# Patient Record
Sex: Female | Born: 1950
Health system: Southern US, Community
[De-identification: ages and names within clinical notes are randomized; demographics above are authoritative.]

## PROBLEM LIST (undated history)

## (undated) DIAGNOSIS — I10 Essential (primary) hypertension: Secondary | ICD-10-CM

## (undated) DIAGNOSIS — I2510 Atherosclerotic heart disease of native coronary artery without angina pectoris: Principal | ICD-10-CM

## (undated) DIAGNOSIS — N3010 Interstitial cystitis (chronic) without hematuria: Secondary | ICD-10-CM

## (undated) DIAGNOSIS — M8580 Other specified disorders of bone density and structure, unspecified site: Secondary | ICD-10-CM

## (undated) DIAGNOSIS — E119 Type 2 diabetes mellitus without complications: Secondary | ICD-10-CM

## (undated) DIAGNOSIS — E785 Hyperlipidemia, unspecified: Secondary | ICD-10-CM

## (undated) DIAGNOSIS — N200 Calculus of kidney: Secondary | ICD-10-CM

## (undated) HISTORY — DX: Atherosclerotic heart disease of native coronary artery without angina pectoris: I25.10

## (undated) HISTORY — DX: Type 2 diabetes mellitus without complications: E11.9

## (undated) HISTORY — DX: Calculus of kidney: N20.0

## (undated) HISTORY — DX: Interstitial cystitis (chronic) without hematuria: N30.10

## (undated) HISTORY — DX: Hyperlipidemia, unspecified: E78.5

## (undated) HISTORY — DX: Other specified disorders of bone density and structure, unspecified site: M85.80

## (undated) HISTORY — DX: Essential (primary) hypertension: I10

---

## 1998-10-11 ENCOUNTER — Other Ambulatory Visit: Admission: RE | Admit: 1998-10-11 | Discharge: 1998-10-11 | Payer: Self-pay | Admitting: Obstetrics and Gynecology

## 1998-11-09 ENCOUNTER — Other Ambulatory Visit: Admission: RE | Admit: 1998-11-09 | Discharge: 1998-11-09 | Payer: Self-pay | Admitting: Obstetrics and Gynecology

## 2000-01-30 ENCOUNTER — Other Ambulatory Visit: Admission: RE | Admit: 2000-01-30 | Discharge: 2000-01-30 | Payer: Self-pay | Admitting: Family Medicine

## 2000-02-12 ENCOUNTER — Other Ambulatory Visit: Admission: RE | Admit: 2000-02-12 | Discharge: 2000-02-12 | Payer: Self-pay

## 2000-04-14 ENCOUNTER — Ambulatory Visit (HOSPITAL_COMMUNITY): Admission: RE | Admit: 2000-04-14 | Discharge: 2000-04-14 | Payer: Self-pay | Admitting: Gastroenterology

## 2002-06-09 ENCOUNTER — Other Ambulatory Visit: Admission: RE | Admit: 2002-06-09 | Discharge: 2002-06-09 | Payer: Self-pay | Admitting: Obstetrics and Gynecology

## 2003-07-27 ENCOUNTER — Other Ambulatory Visit: Admission: RE | Admit: 2003-07-27 | Discharge: 2003-07-27 | Payer: Self-pay | Admitting: Obstetrics and Gynecology

## 2005-03-28 ENCOUNTER — Other Ambulatory Visit: Admission: RE | Admit: 2005-03-28 | Discharge: 2005-03-28 | Payer: Self-pay | Admitting: Obstetrics and Gynecology

## 2006-01-17 ENCOUNTER — Encounter: Admission: RE | Admit: 2006-01-17 | Discharge: 2006-04-17 | Payer: Self-pay | Admitting: Family Medicine

## 2012-08-20 ENCOUNTER — Other Ambulatory Visit: Payer: Self-pay | Admitting: Obstetrics and Gynecology

## 2013-10-11 DIAGNOSIS — R319 Hematuria, unspecified: Secondary | ICD-10-CM | POA: Insufficient documentation

## 2013-10-11 DIAGNOSIS — R109 Unspecified abdominal pain: Secondary | ICD-10-CM | POA: Insufficient documentation

## 2013-10-14 DIAGNOSIS — N201 Calculus of ureter: Secondary | ICD-10-CM | POA: Insufficient documentation

## 2013-11-08 DIAGNOSIS — Z720 Tobacco use: Secondary | ICD-10-CM | POA: Insufficient documentation

## 2013-11-08 DIAGNOSIS — N3010 Interstitial cystitis (chronic) without hematuria: Secondary | ICD-10-CM | POA: Insufficient documentation

## 2013-11-08 DIAGNOSIS — E119 Type 2 diabetes mellitus without complications: Secondary | ICD-10-CM | POA: Insufficient documentation

## 2013-11-26 DIAGNOSIS — N200 Calculus of kidney: Secondary | ICD-10-CM | POA: Insufficient documentation

## 2014-03-21 DIAGNOSIS — N3020 Other chronic cystitis without hematuria: Secondary | ICD-10-CM | POA: Insufficient documentation

## 2014-09-13 ENCOUNTER — Other Ambulatory Visit: Payer: Self-pay | Admitting: Gastroenterology

## 2014-09-15 ENCOUNTER — Other Ambulatory Visit: Payer: Self-pay | Admitting: Obstetrics and Gynecology

## 2014-09-16 LAB — CYTOLOGY - PAP

## 2014-09-26 ENCOUNTER — Other Ambulatory Visit: Payer: Self-pay | Admitting: *Deleted

## 2014-09-26 DIAGNOSIS — N200 Calculus of kidney: Secondary | ICD-10-CM

## 2014-10-10 ENCOUNTER — Ambulatory Visit
Admission: RE | Admit: 2014-10-10 | Discharge: 2014-10-10 | Disposition: A | Discharge status: Home / Self Care | Payer: Managed Care, Other (non HMO) | Source: Ambulatory Visit | Attending: Urology | Admitting: Urology

## 2014-10-10 DIAGNOSIS — N200 Calculus of kidney: Secondary | ICD-10-CM

## 2014-10-13 ENCOUNTER — Other Ambulatory Visit: Payer: Self-pay | Admitting: Family Medicine

## 2014-10-13 DIAGNOSIS — F17200 Nicotine dependence, unspecified, uncomplicated: Secondary | ICD-10-CM

## 2015-10-20 DIAGNOSIS — Z0000 Encounter for general adult medical examination without abnormal findings: Secondary | ICD-10-CM | POA: Insufficient documentation

## 2015-10-20 DIAGNOSIS — H6120 Impacted cerumen, unspecified ear: Secondary | ICD-10-CM | POA: Insufficient documentation

## 2015-10-20 DIAGNOSIS — R8279 Other abnormal findings on microbiological examination of urine: Secondary | ICD-10-CM | POA: Insufficient documentation

## 2015-10-26 ENCOUNTER — Other Ambulatory Visit: Payer: Self-pay | Admitting: Acute Care

## 2015-10-26 ENCOUNTER — Encounter: Payer: Self-pay | Admitting: Acute Care

## 2015-10-26 DIAGNOSIS — Z87891 Personal history of nicotine dependence: Secondary | ICD-10-CM

## 2015-10-27 ENCOUNTER — Other Ambulatory Visit: Payer: Self-pay | Admitting: Acute Care

## 2015-10-31 ENCOUNTER — Encounter: Payer: Managed Care, Other (non HMO) | Admitting: Acute Care

## 2015-11-21 ENCOUNTER — Encounter: Payer: Self-pay | Admitting: Acute Care

## 2015-11-21 ENCOUNTER — Ambulatory Visit (INDEPENDENT_AMBULATORY_CARE_PROVIDER_SITE_OTHER): Payer: Managed Care, Other (non HMO) | Admitting: Acute Care

## 2015-11-21 ENCOUNTER — Ambulatory Visit
Admission: RE | Admit: 2015-11-21 | Discharge: 2015-11-21 | Disposition: A | Discharge status: Home / Self Care | Payer: Managed Care, Other (non HMO) | Source: Ambulatory Visit | Attending: Acute Care | Admitting: Acute Care

## 2015-11-21 DIAGNOSIS — F17210 Nicotine dependence, cigarettes, uncomplicated: Secondary | ICD-10-CM | POA: Diagnosis not present

## 2015-11-21 DIAGNOSIS — Z87891 Personal history of nicotine dependence: Secondary | ICD-10-CM

## 2015-11-21 NOTE — Progress Notes (Signed)
Shared Decision Making Visit Lung Cancer Screening Program [phone])   Eligibility:  Age 64 y.o.  Pack Years Smoking History Calculation 35 pack year smoker (# packs/per year x # years smoked)  Recent History of coughing up blood  no  Unexplained weight loss? no ( >Than 15 pounds within the last 6 months )  Prior History Lung / other cancer no (Diagnosis within the last 5 years already requiring surveillance chest CT Scans).  Smoking Status Current Smoker  Former Smokers: Years since quit: NA  Quit Date: NA  Visit Components:  Discussion included one or more decision making aids. yes  Discussion included risk/benefits of screening. yes  Discussion included potential follow up diagnostic testing for abnormal scans. yes  Discussion included meaning and risk of over diagnosis. yes  Discussion included meaning and risk of False Positives. yes  Discussion included meaning of total radiation exposure. yes  Counseling Included:  Importance of adherence to annual lung cancer LDCT screening. yes  Impact of comorbidities on ability to participate in the program. yes  Ability and willingness to under diagnostic treatment. yes  Smoking Cessation Counseling:  Current Smokers:   Discussed importance of smoking cessation. yes  Information about tobacco cessation classes and interventions provided to patient. yes  Patient provided with "ticket" for LDCT Scan. yes  Symptomatic Patient. no  Counseling  Diagnosis Code: Tobacco Use Z72.0  Asymptomatic Patient yes  Counseling (Intermediate counseling: > three minutes counseling) [id number]  Former Smokers:   Discussed the importance of maintaining cigarette abstinence. NA  Diagnosis Code: Personal History of Nicotine Dependence. [id number]  Information about tobacco cessation classes and interventions provided to patient. Yes   Patient provided with "ticket" for LDCT Scan. yes  Written Order for Lung Cancer Screening with  LDCT placed in Epic. Yes (CT Chest Lung Cancer Screening Low Dose W/O CM) [id number] Z12.2-Screening of respiratory organs Z87.891-Personal history of nicotine dependence  I spent 15 minutes of face to face time with Traci Berger discussing the risks and benefits of lung cancer screening. We viewed a power point together that explained the above noted topics in detail. We paused at intervals to allow for questions to be asked and answered to ensure understanding. We discussed that the single most powerful action that she can take to decrease her risk of lung cancer is to stop smoking. She is currently not ready to quit. I gave her a " Be stronger than your excuses" card with resources and contact information for nicotine replacement therapy, non-nicotine medications, support groups and smoking cessation classes. I have told her to call me when she is ready to quit, and I will make sure she has the tools to be successful. I have given her my card and contact information. We discussed the time and location of her scan, and that I will call her with the results within 24-48 hours of getting them. I also gave her a copy of the power point we reviewed to refer to as needed as a resource. She verbalized understanding of all of the above,and  had no  further questions for me upon leaving the office. She has my card and contact information.   Traci Spatz, NP

## 2015-11-22 ENCOUNTER — Telehealth: Payer: Self-pay | Admitting: Acute Care

## 2015-11-22 NOTE — Telephone Encounter (Signed)
Left VM to return call for results of LDCT.

## 2016-02-05 DIAGNOSIS — J449 Chronic obstructive pulmonary disease, unspecified: Secondary | ICD-10-CM | POA: Insufficient documentation

## 2016-02-05 DIAGNOSIS — Z86010 Personal history of colonic polyps: Secondary | ICD-10-CM | POA: Insufficient documentation

## 2016-02-05 DIAGNOSIS — I10 Essential (primary) hypertension: Secondary | ICD-10-CM | POA: Insufficient documentation

## 2016-02-05 DIAGNOSIS — E118 Type 2 diabetes mellitus with unspecified complications: Secondary | ICD-10-CM | POA: Insufficient documentation

## 2016-02-05 DIAGNOSIS — E782 Mixed hyperlipidemia: Secondary | ICD-10-CM | POA: Insufficient documentation

## 2016-02-05 DIAGNOSIS — D126 Benign neoplasm of colon, unspecified: Secondary | ICD-10-CM | POA: Insufficient documentation

## 2016-02-05 DIAGNOSIS — R809 Proteinuria, unspecified: Secondary | ICD-10-CM | POA: Insufficient documentation

## 2016-02-05 DIAGNOSIS — E7800 Pure hypercholesterolemia, unspecified: Secondary | ICD-10-CM | POA: Insufficient documentation

## 2016-02-05 DIAGNOSIS — M8580 Other specified disorders of bone density and structure, unspecified site: Secondary | ICD-10-CM | POA: Insufficient documentation

## 2016-02-08 ENCOUNTER — Encounter: Payer: Self-pay | Admitting: Cardiology

## 2016-02-08 ENCOUNTER — Ambulatory Visit (INDEPENDENT_AMBULATORY_CARE_PROVIDER_SITE_OTHER): Payer: Managed Care, Other (non HMO) | Admitting: Cardiology

## 2016-02-08 VITALS — BP 134/70 | HR 78 | Ht 62.0 in | Wt 166.0 lb

## 2016-02-08 DIAGNOSIS — I10 Essential (primary) hypertension: Secondary | ICD-10-CM | POA: Diagnosis not present

## 2016-02-08 DIAGNOSIS — E7800 Pure hypercholesterolemia, unspecified: Secondary | ICD-10-CM

## 2016-02-08 DIAGNOSIS — I2510 Atherosclerotic heart disease of native coronary artery without angina pectoris: Secondary | ICD-10-CM

## 2016-02-08 DIAGNOSIS — Z720 Tobacco use: Secondary | ICD-10-CM | POA: Diagnosis not present

## 2016-02-08 HISTORY — DX: Atherosclerotic heart disease of native coronary artery without angina pectoris: I25.10

## 2016-02-08 NOTE — Patient Instructions (Signed)
Medication Instructions:  Your physician recommends that you continue on your current medications as directed. Please refer to the Current Medication list given to you today.   Labwork: None  Testing/Procedures: Dr. Turner recommends you have a NUCLEAR STRESS TEST.  Follow-Up: Your physician wants you to follow-up in: 1 year with Dr. Turner. You will receive a reminder letter in the mail two months in advance. If you don't receive a letter, please call our office to schedule the follow-up appointment.   Any Other Special Instructions Will Be Listed Below (If Applicable).     If you need a refill on your cardiac medications before your next appointment, please call your pharmacy.   

## 2016-02-08 NOTE — Progress Notes (Signed)
Cardiology Office Note   Date:  02/08/2016   ID:  Traci Berger, DOB 07/31/1951, MRN [id number]  PCP:  Gennette Pac, MD    No chief complaint on file.     History of Present Illness: Traci Berger is a 65 y.o. female who presents for evaluation of coronary artery calcifications noted on Chest CT.  She is asymptomatic from a cardiac standpoint. Chest CT was done for lund CA screening.   She denies any chest pain, LE edema, dizziness or syncope.   She has a skipped heart beat rarely.  She does have some DOE when she first starts out walking but once she continues to walk it improves.  She has a history of dyslipidemia, HTN and type 2 DM.  There is no family history of CAD.  She continues to smoke.      Past Medical History  Diagnosis Date  . Diabetes mellitus without complication ([place])   . Hypertension   . Hyperlipidemia   . Osteopenia   . Interstitial cystitis   . Kidney stones     History reviewed. No pertinent past surgical history.   Current Outpatient Prescriptions  Medication Sig Dispense Refill  . aspirin EC 81 MG tablet Take 81 mg by mouth daily.    . Cholecalciferol (VITAMIN D) 2000 units CAPS Take 2,000 Units by mouth daily.    Marland Kitchen glimepiride (AMARYL) 2 MG tablet Take 2 mg by mouth 2 (two) times daily.    . irbesartan-hydrochlorothiazide (AVALIDE) 300-12.5 MG tablet Take 1 tablet by mouth daily.    . metFORMIN (GLUCOPHAGE) 1000 MG tablet Take 1,000 mg by mouth 2 (two) times daily.    . pravastatin (PRAVACHOL) 80 MG tablet Take 80 mg by mouth daily.    . timolol (TIMOPTIC) 0.25 % ophthalmic solution Place 1 drop into both eyes daily.     No current facility-administered medications for this visit.    Allergies:   Other; Sulfa antibiotics; Macrobid ; Ramipril; and Simvastatin    Social History:  The patient  reports that she has been smoking Cigarettes.  She has a 35 pack-year smoking history. She does not have any smokeless  tobacco history on file.   Family History:  The patient's family history includes Arrhythmia in her mother; Colon cancer in her father.    ROS:  Please see the history of present illness.   Otherwise, review of systems are positive for none.   All other systems are reviewed and negative.    PHYSICAL EXAM: VS:  BP 134/70 mmHg  Pulse 78  Ht 5\' 2"  (1.575 m)  Wt 166 lb (75.297 kg)  BMI 30.35 kg/m2 , BMI Body mass index is 30.35 kg/(m^2). GEN: Well nourished, well developed, in no acute distress HEENT: normal Neck: no JVD, carotid bruits, or masses Cardiac: RRR; no murmurs, rubs, or gallops,no edema  Respiratory:  clear to auscultation bilaterally, normal work of breathing GI: soft, nontender, nondistended, + BS MS: no deformity or atrophy Skin: warm and dry, no rash Neuro:  Strength and sensation are intact Psych: euthymic mood, full affect   EKG:  EKG  ordered today and showed NSR with no ST changes    Recent Labs: No results found for requested labs within last 365 days.    Lipid Panel No results found for: CHOL, TRIG, HDL, CHOLHDL, VLDL, LDLCALC, LDLDIRECT    Wt Readings from Last 3 Encounters:  02/08/16 166 lb (75.297 kg)      ASSESSMENT AND PLAN:  1.  Coronary artery calcifications on chest CT with no anginal symptoms except for DOE but she continues to smoke. Her CRF include HTN, dyslipidemia, DM, ongoing tobacco use and postmenopausal state.   I will get a stress myoview to rule out ischemia.  Continue ASA/statin.  2.  HTN - controlled 3.  Dyslipidemia followed by her PCP - I will get a copy of her lipids from PCP.  Her LDL goal is now < 70. 4.  Tobacco abuse - I have encouraged her to try to quit smoking   Current medicines are reviewed at length with the patient today.  The patient does not have concerns regarding medicines.  The following changes have been made:  no change  Labs/ tests ordered today: See above Assessment and Plan No orders of the defined  types were placed in this encounter.     Disposition:   FU with me in 1 year  Signed, Sueanne Margarita, MD  02/08/2016 2:14 PM    [facility] [place], [place], [place]  [address] Phone: [phone]; Fax: [phone]

## 2016-02-19 ENCOUNTER — Encounter: Payer: Self-pay | Admitting: Cardiology

## 2016-02-22 ENCOUNTER — Telehealth: Payer: Self-pay

## 2016-02-22 DIAGNOSIS — E782 Mixed hyperlipidemia: Secondary | ICD-10-CM

## 2016-02-22 DIAGNOSIS — E7800 Pure hypercholesterolemia, unspecified: Secondary | ICD-10-CM

## 2016-02-22 MED ORDER — EZETIMIBE 10 MG PO TABS: 10.0000 mg | ORAL_TABLET | Freq: Every day | ORAL | Status: DC

## 2016-02-22 NOTE — Telephone Encounter (Signed)
-----   Message from Sueanne Margarita, MD sent at 02/19/2016  1:28 PM EDT ----- LDL not at goal of < 70.  LDL was 89.  Please add zetia 10mg  daily and recheck FLP and ALT in 8 weeks

## 2016-02-22 NOTE — Telephone Encounter (Signed)
Instructed patient to START ZETIA 10 mg daily.  FLP and ALT scheduled 5/17. Patient agrees with treatment plan.

## 2016-02-28 ENCOUNTER — Other Ambulatory Visit: Payer: Self-pay | Admitting: Acute Care

## 2016-02-28 DIAGNOSIS — F17210 Nicotine dependence, cigarettes, uncomplicated: Secondary | ICD-10-CM

## 2016-03-20 ENCOUNTER — Telehealth (HOSPITAL_COMMUNITY): Payer: Self-pay | Admitting: *Deleted

## 2016-03-20 NOTE — Telephone Encounter (Signed)
Patient given detailed instructions per Myocardial Perfusion Study Information Sheet for the test on 03/25/16 Patient notified to arrive 15 minutes early and that it is imperative to arrive on time for appointment to keep from having the test rescheduled.  If you need to cancel or reschedule your appointment, please call the office within 24 hours of your appointment. Failure to do so may result in a cancellation of your appointment, and a $50 no show fee. Patient verbalized understanding.Lancer Thurner J Jazmynn Pho, RN  

## 2016-03-22 ENCOUNTER — Ambulatory Visit (HOSPITAL_COMMUNITY): Payer: Managed Care, Other (non HMO) | Attending: Cardiovascular Disease

## 2016-03-22 DIAGNOSIS — I10 Essential (primary) hypertension: Secondary | ICD-10-CM | POA: Insufficient documentation

## 2016-03-22 DIAGNOSIS — I2510 Atherosclerotic heart disease of native coronary artery without angina pectoris: Secondary | ICD-10-CM | POA: Diagnosis not present

## 2016-03-22 DIAGNOSIS — R0602 Shortness of breath: Secondary | ICD-10-CM | POA: Insufficient documentation

## 2016-03-22 DIAGNOSIS — E119 Type 2 diabetes mellitus without complications: Secondary | ICD-10-CM | POA: Insufficient documentation

## 2016-03-22 LAB — MYOCARDIAL PERFUSION IMAGING
CHL CUP NUCLEAR SDS: 3
CHL CUP NUCLEAR SRS: 0
CHL CUP NUCLEAR SSS: 3
LHR: 0.3
LV sys vol: 14 mL
LVDIAVOL: 58 mL (ref 46–106)
Peak HR: 133 {beats}/min
Rest HR: 68 {beats}/min
TID: 0.92

## 2016-03-22 MED ORDER — TECHNETIUM TC 99M SESTAMIBI GENERIC - CARDIOLITE
32.1000 | Freq: Once | INTRAVENOUS | Status: AC | PRN
  Administered 2016-03-22: 32.1 via INTRAVENOUS

## 2016-03-22 MED ORDER — REGADENOSON 0.4 MG/5ML IV SOLN
0.4000 mg | Freq: Once | INTRAVENOUS | Status: AC
  Administered 2016-03-22: 0.4 mg via INTRAVENOUS

## 2016-03-22 MED ORDER — TECHNETIUM TC 99M SESTAMIBI GENERIC - CARDIOLITE
10.3000 | Freq: Once | INTRAVENOUS | Status: AC | PRN
  Administered 2016-03-22: 10 via INTRAVENOUS

## 2016-03-26 ENCOUNTER — Telehealth: Payer: Self-pay | Admitting: Cardiology

## 2016-03-26 DIAGNOSIS — Z79899 Other long term (current) drug therapy: Secondary | ICD-10-CM

## 2016-03-26 NOTE — Telephone Encounter (Signed)
Informed patient of results and verbal understanding expressed.    Patient reports only taking Zetia for 8 days because it caused extreme fatigue. She has not taken in a week and feels better. She refuses to take total amount but st she is willing to try Zetia 5 mg if Dr. Radford Pax agrees. She requests medication recommendations from Dr. Radford Pax to be left on her cell phone VM since she cannot answer at work.

## 2016-03-26 NOTE — Telephone Encounter (Signed)
-----   Message from Sueanne Margarita, MD sent at 03/22/2016  1:26 PM EDT ----- Please let patient know that stress test was fine

## 2016-03-26 NOTE — Telephone Encounter (Signed)
New message ° ° ° ° °Returning a call to the nurse to get lab results °

## 2016-03-27 MED ORDER — EZETIMIBE 10 MG PO TABS: 5.0000 mg | ORAL_TABLET | Freq: Every day | ORAL | Status: DC

## 2016-03-27 NOTE — Telephone Encounter (Signed)
Decrease Zetia to 5mg  daily and repeat FLP and ALT in 8 weeks

## 2016-03-27 NOTE — Telephone Encounter (Signed)
Called patient about Dr. Theodosia Blender orders. Per Dr. Radford Pax, decrease Zetia to 5 mg daily and repeat FLP and ALT in 8 weeks. Patient stated she is seeing her PCP, Dr. Rex Kras, around 8 weeks. Patient stated she will get her lab work done with her PCP. Requested patient have Dr. Eddie Dibbles office fax labs to our office, and informed her of the lab work that she will need. Will route note to Dr. Rex Kras so he is aware. Will schedule lab work at our office if needed.

## 2016-04-24 ENCOUNTER — Other Ambulatory Visit: Payer: Managed Care, Other (non HMO)

## 2016-11-15 ENCOUNTER — Encounter: Payer: Self-pay | Admitting: Acute Care

## 2016-11-26 ENCOUNTER — Ambulatory Visit: Payer: Managed Care, Other (non HMO)

## 2016-12-24 ENCOUNTER — Ambulatory Visit
Admission: RE | Admit: 2016-12-24 | Discharge: 2016-12-24 | Disposition: A | Discharge status: Home / Self Care | Payer: Managed Care, Other (non HMO) | Source: Ambulatory Visit | Attending: Acute Care | Admitting: Acute Care

## 2016-12-24 DIAGNOSIS — F17210 Nicotine dependence, cigarettes, uncomplicated: Secondary | ICD-10-CM

## 2017-01-14 ENCOUNTER — Other Ambulatory Visit: Payer: Self-pay | Admitting: Acute Care

## 2017-01-14 DIAGNOSIS — F17210 Nicotine dependence, cigarettes, uncomplicated: Secondary | ICD-10-CM

## 2017-02-11 ENCOUNTER — Encounter: Payer: Self-pay | Admitting: Cardiology

## 2017-02-18 ENCOUNTER — Ambulatory Visit: Payer: Managed Care, Other (non HMO) | Admitting: Cardiology

## 2017-04-02 ENCOUNTER — Ambulatory Visit: Payer: Managed Care, Other (non HMO) | Admitting: Cardiology

## 2017-04-24 ENCOUNTER — Ambulatory Visit (INDEPENDENT_AMBULATORY_CARE_PROVIDER_SITE_OTHER): Payer: Managed Care, Other (non HMO) | Admitting: Cardiology

## 2017-04-24 ENCOUNTER — Encounter (INDEPENDENT_AMBULATORY_CARE_PROVIDER_SITE_OTHER): Payer: Self-pay

## 2017-04-24 ENCOUNTER — Encounter: Payer: Self-pay | Admitting: Cardiology

## 2017-04-24 VITALS — BP 112/66 | HR 74 | Ht 62.0 in | Wt 163.0 lb

## 2017-04-24 DIAGNOSIS — E7800 Pure hypercholesterolemia, unspecified: Secondary | ICD-10-CM

## 2017-04-24 DIAGNOSIS — I10 Essential (primary) hypertension: Secondary | ICD-10-CM | POA: Diagnosis not present

## 2017-04-24 DIAGNOSIS — I2510 Atherosclerotic heart disease of native coronary artery without angina pectoris: Secondary | ICD-10-CM

## 2017-04-24 NOTE — Patient Instructions (Signed)
Medication Instructions:  Your physician recommends that you continue on your current medications as directed. Please refer to the Current Medication list given to you today.   Labwork: Your physician recommends that you return for FASTING lab work in [place].  Testing/Procedures: None  Follow-Up: Your physician wants you to follow-up in: 1 year with Dr. Radford Pax. You will receive a reminder letter in the mail two months in advance. If you don't receive a letter, please call our office to schedule the follow-up appointment.   Any Other Special Instructions Will Be Listed Below (If Applicable).     If you need a refill on your cardiac medications before your next appointment, please call your pharmacy.

## 2017-04-24 NOTE — Progress Notes (Signed)
Cardiology Office Note    Date:  04/24/2017   ID:  Traci Berger, DOB 1951-01-14, MRN [id number]  PCP:  Hulan Fess, MD  Cardiologist:  Fransico Him, MD   Chief Complaint  Patient presents with  . Follow-up    coronary artery calcifications, HTN, hyperlidipemia    History of Present Illness:  Traci Berger is a 66 y.o. female who presents for followup of coronary artery calcifications noted on Chest CT done for lung screening.  She underwent nuclear stress test that showed no ischemia .  She has a history of dyslipidemia, HTN and type 2 DM.  There is no family history of CAD.  She continues to smoke.  She says that after starting Zetia she felt very fatigued more than usual and she stopped it and she feels better.    She is here today for followup and she is asymptomatic from a cardiac standpoint.   She denies any chest pain or pressure, LE edema, PND, orthopnea, dizziness or syncope.   She has a skipped heart beat rarely.  She has some mild DOE when walking her her work building but that is stable and has not changed from when I saw her last.    Past Medical History:  Diagnosis Date  . Coronary artery calcification seen on CAT scan 02/08/2016  . Diabetes mellitus without complication ([place])   . Hyperlipidemia   . Hypertension   . Interstitial cystitis   . Kidney stones   . Osteopenia     No past surgical history on file.  Current Medications: Current Meds  Medication Sig  . aspirin EC 81 MG tablet Take 81 mg by mouth daily.  . Cholecalciferol (VITAMIN D) 2000 units CAPS Take 2,000 Units by mouth daily.  Marland Kitchen glimepiride (AMARYL) 2 MG tablet Take 2 mg by mouth 2 (two) times daily.  . irbesartan-hydrochlorothiazide (AVALIDE) 300-12.5 MG tablet Take 1 tablet by mouth daily.  . metFORMIN (GLUCOPHAGE) 1000 MG tablet Take 1,000 mg by mouth 2 (two) times daily.  . pravastatin (PRAVACHOL) 80 MG tablet Take 80 mg by mouth daily.  . timolol (TIMOPTIC) 0.25 % ophthalmic  solution Place 1 drop into both eyes daily.  . [DISCONTINUED] ezetimibe (ZETIA) 10 MG tablet Take 0.5 tablets (5 mg total) by mouth daily.    Allergies:   Other; Sulfa antibiotics; Sulfacetamide sodium; Macrobid  [nitrofurantoin monohyd macro]; Nitrofurantoin macrocrystal; Ramipril; and Simvastatin   Social History   Social History  . Marital status: Married    Spouse name: N/A  . Number of children: N/A  . Years of education: N/A   Social History Main Topics  . Smoking status: Current Every Day Smoker    Packs/day: 1.00    Years: 35.00    Types: Cigarettes  . Smokeless tobacco: Never Used     Comment: Counseled to quit smoking.  . Alcohol use No  . Drug use: No  . Sexual activity: Not Asked   Other Topics Concern  . None   Social History Narrative  . None     Family History:  The patient's family history includes Arrhythmia in her mother; Colon cancer in her father.   ROS:   Please see the history of present illness.    ROS All other systems reviewed and are negative.  No flowsheet data found.     PHYSICAL EXAM:   VS:  BP 112/66   Pulse 74   Ht 5\' 2"  (1.575 m)   Wt 163 lb (73.9  kg)   BMI 29.81 kg/m    GEN: Well nourished, well developed, in no acute distress  HEENT: normal  Neck: no JVD, carotid bruits, or masses Cardiac: RRR; no murmurs, rubs, or gallops,no edema.  Intact distal pulses bilaterally.  Respiratory:  clear to auscultation bilaterally, normal work of breathing GI: soft, nontender, nondistended, + BS MS: no deformity or atrophy  Skin: warm and dry, no rash Neuro:  Alert and Oriented x 3, Strength and sensation are intact Psych: euthymic mood, full affect  Wt Readings from Last 3 Encounters:  04/24/17 163 lb (73.9 kg)  02/08/16 166 lb (75.3 kg)      Studies/Labs Reviewed:   EKG:  EKG is ordered today.  The ekg ordered today demonstrates NSR with short PR with no ST changes  Recent Labs: No results found for requested labs within last  8760 hours.   Lipid Panel No results found for: CHOL, TRIG, HDL, CHOLHDL, VLDL, LDLCALC, LDLDIRECT  Additional studies/ records that were reviewed today include:  none    ASSESSMENT:    1. Coronary artery calcification seen on CAT scan   2. Essential (primary) hypertension   3. Pure hypercholesterolemia      PLAN:  In order of problems listed above:  1. Coronary artery calcifications on CT with no ischemia on nuclear stress test last year. She denies any anginal symptoms.  She will continue on ASA and statin.   2. HTN - Her BP is adequately controlled on current meds.  She will continue on Avalide 300/12.5mg  daily.   3. Hyperlipidemia with LDL goal < 70. She will continue on statin.  She is on pravachol 80mg  daily and her LDL has not been at goal.  She did not tolerate zetia, crestor or simvastatin in the past.  She is retiring and she wants to dry diet before considering lipitor or Praulent.      Medication Adjustments/Labs and Tests Ordered: Current medicines are reviewed at length with the patient today.  Concerns regarding medicines are outlined above.  Medication changes, Labs and Tests ordered today are listed in the Patient Instructions below.  There are no Patient Instructions on file for this visit.   Signed, Fransico Him, MD  04/24/2017 4:02 PM    [facility] [place], [place], [place]  [address] Phone: [phone]; Fax: [phone]

## 2017-05-01 NOTE — Addendum Note (Signed)
Addended by: Patterson Hammersmith A on: 05/01/2017 02:22 PM   Modules accepted: Orders

## 2017-05-15 ENCOUNTER — Ambulatory Visit (INDEPENDENT_AMBULATORY_CARE_PROVIDER_SITE_OTHER): Payer: Managed Care, Other (non HMO) | Admitting: Allergy & Immunology

## 2017-05-15 ENCOUNTER — Encounter: Payer: Self-pay | Admitting: Allergy & Immunology

## 2017-05-15 VITALS — BP 122/60 | HR 68 | Temp 98.0°F | Resp 16 | Ht 61.0 in | Wt 163.6 lb

## 2017-05-15 DIAGNOSIS — R220 Localized swelling, mass and lump, head: Secondary | ICD-10-CM

## 2017-05-15 DIAGNOSIS — J449 Chronic obstructive pulmonary disease, unspecified: Secondary | ICD-10-CM | POA: Diagnosis not present

## 2017-05-15 DIAGNOSIS — L243 Irritant contact dermatitis due to cosmetics: Secondary | ICD-10-CM | POA: Diagnosis not present

## 2017-05-15 MED ORDER — BUDESONIDE-FORMOTEROL FUMARATE 160-4.5 MCG/ACT IN AERO: 2.0000 | INHALATION_SPRAY | Freq: Two times a day (BID) | RESPIRATORY_TRACT | 5 refills | Status: DC

## 2017-05-15 NOTE — Patient Instructions (Addendum)
1. Tongue swelling - Testing today showed: mild reactivity to egg - We will get testing to look for an egg allergy, nut allergy, and chocolate allergy - We will get some labs to rule out serious causes of tongue swelling: C1 esterase inhibitor level and function (to rule out hereditary angioedema), alpha-gal panel (to rule out alpha-gal sensitivity with allergic reactions to red meats), inflammatory markers (to rule out rheumatologic causes of swelling), complete blood count (to makes sure that your blood counts are normal), and a complete metabolic panel (to make sure that your liver and kidneys are working correctly). - EpiPen teaching reviewed.  - Continue to keep a journal of exposures so that we can correlate them with tongue swelling episodes. - One thing to consider is that your blood pressure medication might be causing the tongue swelling.  - Although angiotensin receptor blockers (the irbesartan component of your blood pressure medication) is less likely to cause swelling episodes than other blood pressure medications, this might be related nonetheless.  2. Irritant contact dermatitis due to cosmetics - Try using a different moisturizers to see whether there are additives to the moisturizer that are contributing to your rash on your hands.  - Good options include Vanicream, Cerve, Eucerin, or others.   3. Chronic obstructive pulmonary disease - Lung testing today was not entirely normal, and it did not improve with albuterol. - However since you felt better after the treatment, I would like you to try using a daily medication: Symbicort 160/4.5 two puffs twice daily. - Daily controller medication(s): Symbicort 160/4.5 two puffs in the morning and two puffs at night with spacer - Rescue medications: ProAir four puffs every 4-6 hours as needed - Asthma/COPD control goals:  * Full participation in all desired activities (may need albuterol before activity) * Albuterol use two time or less  a week on average (not counting use with activity) * Cough interfering with sleep two time or less a month * Oral steroids no more than once a year * No hospitalizations  4. Return in about 2 months (around 07/15/2017).  Please inform us of any Emergency Department visits, hospitalizations, or changes in symptoms. Call us before going to the ED for breathing or allergy symptoms since we might be able to fit you in for a sick visit. Feel free to contact us anytime with any questions, problems, or concerns.  It was a pleasure to meet you today! Happy summer!   Websites that have reliable patient information: 1. American Academy of Asthma, Allergy, and Immunology: www.aaaai.org 2. Food Allergy Research and Education (FARE): foodallergy.org 3. Mothers of Asthmatics: http://www.asthmacommunitynetwork.org 4. American College of Allergy, Asthma, and Immunology: www.acaai.org    To healthy skin apply Aquaphor, Eucerin, Vanicream, Cerave, or Vaseline jelly twice a day.

## 2017-05-15 NOTE — Progress Notes (Addendum)
NEW PATIENT  Date of Service/Encounter:  05/15/17  Referring provider: Hulan Fess, MD   Assessment:   Tongue swelling  Irritant contact dermatitis - likely due to the Jergens lotion  Chronic obstructive pulmonary disease - very mild  Plan/Recommendations:   1. Tongue swelling - unknown etiology but with equivocal testing to egg - Testing today showed: mild reactivity to egg but otherwise negative.  - She is very concerned that this was related to peanuts and/or chocolate, therefore we will obtain lab work to rule this out.  - We will get testing to look for an egg allergy, nut allergy, and chocolate allergy - In retrospect, she thinks that  - We will get some labs to rule out serious causes of tongue swelling: C1 esterase inhibitor level and function (to rule out hereditary angioedema), alpha-gal panel (to rule out alpha-gal sensitivity with allergic reactions to red meats), inflammatory markers (to rule out rheumatologic causes of swelling), complete blood count (to makes sure that your blood counts are normal), and a complete metabolic panel (to make sure that your liver and kidneys are working correctly). - EpiPen teaching reviewed.  - Continue to keep a journal of exposures so that we can correlate them with tongue swelling episodes. - One thing to consider is that Traci Berger's blood pressure medication might be causing the tongue swelling.  - Although angiotensin receptor blockers (the irbesartan component of her blood pressure medication) is less likely to cause swelling episodes than other blood pressure medications, this might be related nonetheless since it affects the same pathway as ACE inhibitors.   2. Irritant contact dermatitis due to cosmetics - Try using a different moisturizers to see whether there are additives to the moisturizer that are contributing to your rash on your hands.  - Good options include Vanicream, Cerve, Eucerin, or others.   3. Chronic  obstructive pulmonary disease - uncontrolled with recurrent episodes of bronchitis - Lung testing today was not entirely normal, and it did not improve with albuterol.  - We cannot make the diagnosis of COPD based on today's spirometry, but according to Traci Berger she has been diagnosed with this in the past.  - However since you felt better after the treatment, I would like you to try using a daily medication: Symbicort 160/4.5 two puffs twice daily. - Daily controller medication(s): Symbicort 160/4.5 two puffs in the morning and two puffs at night with spacer - Rescue medications: ProAir four puffs every 4-6 hours as needed - Asthma/COPD control goals:  * Full participation in all desired activities (may need albuterol before activity) * Albuterol use two time or less a week on average (not counting use with activity) * Cough interfering with sleep two time or less a month * Oral steroids no more than once a year * No hospitalizations  4. Return in about 2 months (around 07/15/2017).    Subjective:   Traci Berger is a 66 y.o. female presenting today for evaluation of  Chief Complaint  Patient presents with  . Angioedema  . Allergic Reaction    Traci Berger has a history of the following: Patient Active Problem List   Diagnosis Date Noted  . Coronary artery calcification seen on CAT scan 02/08/2016  . Chronic obstructive pulmonary disease ([place]) 02/05/2016  . Essential (primary) hypertension 02/05/2016  . Microalbuminuria 02/05/2016  . Combined fat and carbohydrate induced hyperlipemia 02/05/2016  . Other specified disorders of bone density and structure, unspecified site 02/05/2016  . History of  colon polyps 02/05/2016  . Pure hypercholesterolemia 02/05/2016  . Tubular adenoma of colon 02/05/2016  . Disorder associated with type 2 diabetes mellitus ([place]) 02/05/2016  . Abnormal findings on microbiological examination of urine 10/20/2015  . Encounter for general  adult medical examination without abnormal findings 10/20/2015  . Wax in ear 10/20/2015  . Bladder infection, chronic 03/21/2014  . Calculus of kidney 11/26/2013  . Chronic interstitial cystitis 11/08/2013  . Current tobacco use 11/08/2013  . Type 2 diabetes mellitus ([place]) 11/08/2013  . Calculi, ureter 10/14/2013  . Flank pain 10/11/2013  . Blood in the urine 10/11/2013    History obtained from: chart review and patient.  Traci Berger was referred by Traci Fess, MD.     Omni is a 66 y.o. female presenting for an evaluation of swelling. The first episode 3-4 weeks ago. She playing poker and losing unfortunately. Around 4am around two hours after going to bed, she woke up with facial swelling. She had no systemic reactions including wheezing, abdominal pain, or a rash. There was no itching during this episode. She was drooling out of the side of her mouth and she felt that she could not talk. This was not pruritic. The swelling improved over the period of one hour. Then she woke up around 6am and she had swelling once again. She  Called her RN daughter who recommended that she go to the ED. By the time she got to the ED, she had actually improved. This was 04/05/17. Of note, she had completed a course of cefuroxime and prednisone for bronchitis. At that time, she was given benadryl and a short course of steroids. She also was prescibed in EpiPen. She has avoided peanuts and tree nuts. She denies tick bites but she has had bug bites. She did likely have red meat during that night that it first happened. She does eat red meat around 2-3 times per week.    Then two weeks later, she developed redness on her left hand. She was treated with a spray and benadryl. This helped within a couple of days. The same thing happened on the right side. She went to see her PCP where she was given a cortisone shot and was told to take Benadryl two tablets every six hours to treat it. She took this through this  past Saturday. She was unable to move her hands because of the swelling. She did not notice that there was any food involved in this process as well. She does use Recruitment consultant lotion.  Jergens Original Lotion ingredients:   She has been on this Avalide (ARB + diuretic) for 8 years. She was never on an ACE inhibitor to her knowledge. Otherwise, there are no new medications.   Asthma/Respiratory Symptom History: She has been on prednisone multiple times since January. She has been on them 3-4 times since January. She is a smoker (30+ years) and has albuterol as needed. She has been diagnosed with COPD but has never been on daily medications for this. She has never been on a daily medication for breathing. Prior to this year, she was not on prednisone at all. She denies any current coughing or wheezing unless she has bronchitis. She has no problems with nighttime symptoms as long as she does not have bronchitis. She has no symptoms with physical activity.   Allergic Rhinitis Symptom History: She never had any seasonal allergy symptoms. She will sneeze during certain times of the year. she has no problems around  any animals. She does not take any regular antihistamines or nasal sprays. She is interested in environmental allergy testing today.   Otherwise, there is no history of other atopic diseases, including stinging insect allergies or urticaria. There is no significant infectious history. Vaccinations are up to date.    Past Medical History: Patient Active Problem List   Diagnosis Date Noted  . Coronary artery calcification seen on CAT scan 02/08/2016  . Chronic obstructive pulmonary disease ([place]) 02/05/2016  . Essential (primary) hypertension 02/05/2016  . Microalbuminuria 02/05/2016  . Combined fat and carbohydrate induced hyperlipemia 02/05/2016  . Other specified disorders of bone density and structure, unspecified site 02/05/2016  . History of colon polyps 02/05/2016  . Pure  hypercholesterolemia 02/05/2016  . Tubular adenoma of colon 02/05/2016  . Disorder associated with type 2 diabetes mellitus ([place]) 02/05/2016  . Abnormal findings on microbiological examination of urine 10/20/2015  . Encounter for general adult medical examination without abnormal findings 10/20/2015  . Wax in ear 10/20/2015  . Bladder infection, chronic 03/21/2014  . Calculus of kidney 11/26/2013  . Chronic interstitial cystitis 11/08/2013  . Current tobacco use 11/08/2013  . Type 2 diabetes mellitus ([place]) 11/08/2013  . Calculi, ureter 10/14/2013  . Flank pain 10/11/2013  . Blood in the urine 10/11/2013    Medication List:  Allergies as of 05/15/2017      Reactions   Other Other (See Comments)   "bladder medication" unsure of name, makes her feel bad   Sulfa Antibiotics Nausea And Vomiting   Sulfacetamide Sodium Nausea And Vomiting   Macrobid  [nitrofurantoin Monohyd Macro] Nausea Only   Nitrofurantoin Macrocrystal Nausea Only   Ramipril Other (See Comments)   Simvastatin Other (See Comments)      Medication List       Accurate as of 05/15/17 12:01 PM. Always use your most recent med list.          aspirin EC 81 MG tablet Take 81 mg by mouth daily.   budesonide-formoterol 160-4.5 MCG/ACT inhaler Commonly known as:  SYMBICORT Inhale 2 puffs into the lungs 2 (two) times daily.   diphenhydrAMINE 25 mg capsule Commonly known as:  BENADRYL Take 25 mg by mouth every 6 (six) hours as needed.   glimepiride 2 MG tablet Commonly known as:  AMARYL Take 2 mg by mouth 2 (two) times daily.   irbesartan-hydrochlorothiazide 300-12.5 MG tablet Commonly known as:  AVALIDE Take 1 tablet by mouth daily.   metFORMIN 1000 MG tablet Commonly known as:  GLUCOPHAGE Take 1,000 mg by mouth 2 (two) times daily.   pravastatin 80 MG tablet Commonly known as:  PRAVACHOL Take 80 mg by mouth daily.   timolol 0.5 % ophthalmic solution Commonly known as:  TIMOPTIC INT 1 GTT IN OU BID     Vitamin D 2000 units Caps Take 2,000 Units by mouth daily.       Birth History: non-contributory.   Developmental History: non-contributory.   Past Surgical History: History reviewed. No pertinent surgical history.   Family History: Family History  Problem Relation Age of Onset  . Arrhythmia Mother   . Colon cancer Father   . Allergic rhinitis Neg Hx   . Angioedema Neg Hx   . Asthma Neg Hx   . Eczema Neg Hx   . Immunodeficiency Neg Hx   . Urticaria Neg Hx      Social History: Aveline lives at home with her husband. She lives in a 28yo home. There is carpeting throughout the  home. They have gas heating and central cooling. There are two dogs outside of the home. There are no dust mite coverings on the bedding. There is tobacco exposure. 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 She is happy to be retiring at the end of the month.     Review of Systems: a 14-point review of systems is pertinent for what is mentioned in HPI.  Otherwise, all other systems were negative. Constitutional: negative other than that listed in the HPI Eyes: negative other than that listed in the HPI Ears, nose, mouth, throat, and face: negative other than that listed in the HPI Respiratory: negative other than that listed in the HPI Cardiovascular: negative other than that listed in the HPI Gastrointestinal: negative other than that listed in the HPI Genitourinary: negative other than that listed in the HPI Integument: negative other than that listed in the HPI Hematologic: negative other than that listed in the HPI Musculoskeletal: negative other than that listed in the HPI Neurological: negative other than that listed in the HPI Allergy/Immunologic: negative other than that listed in the HPI    Objective:   Blood pressure 122/60, pulse 68, temperature 98 F (36.7 C), temperature source Oral, resp. rate 16, height 5\' 1"  (1.549 m), weight 163 lb 9.6 oz  (74.2 kg), SpO2 97 %. Body mass index is 30.91 kg/m.   Physical Exam:  General: Alert, interactive, in no acute distress. Pleasant slightly obese female.  Eyes: No conjunctival injection present on the right, No conjunctival injection present on the left, No discharge on the right, No discharge on the left and No Horner-Trantas dots present Ears: Right TM pearly gray with normal light reflex, Left TM pearly gray with normal light reflex, Right TM intact without perforation and Left TM intact without perforation.  Nose/Throat: External nose within normal limits and septum midline, turbinates edematous with clear discharge, post-pharynx mildly erythematous without cobblestoning in the posterior oropharynx. Tonsils 2+ without exudates Neck: Supple without thyromegaly.  Adenopathy: no enlarged lymph nodes appreciated in the anterior cervical, occipital, axillary, epitrochlear, inguinal, or popliteal regions Lungs: Clear to auscultation without wheezing, rhonchi or rales. No increased work of breathing. CV: Normal S1/S2, no murmurs. Capillary refill <2 seconds.  Abdomen: Nondistended, nontender. No guarding or rebound tenderness. Bowel sounds present in all fields and hyperactive  Skin: Warm and dry, without lesions or rashes. There is some mild erythema on the bilateral hands. No noticeable swelling.  Extremities:  No clubbing, cyanosis or edema. Neuro:   Grossly intact. No focal deficits appreciated. Responsive to questions.  Diagnostic studies:   Spirometry: results abnormal (FEV1: 1.47/72%, FVC: 1.93/75%, FEV1/FVC: 76%).    Spirometry consistent with possible restrictive disease. Albuterol nebulizer treatment given in clinic with no improvement. However, she was able to have an exhalation phase nearing 6 seconds following the albuterol treatment.  Allergy Studies:   Indoor/Outdoor Percutaneous Adult Environmental Panel: negative to the entire panel with adequate controls.  Most Common  Foods Panel (peanut, tree nuts, soy, fish mix, shellfish mix, wheat, milk, egg): Slightly reactive to egg (1 x 3). Otherwise negative to the entire panel of the most common foods including all of the tree nuts, individual fish, and individual shellfish as well as chocolate.     Salvatore Marvel, MD [facility]

## 2017-05-22 LAB — COMPLETE METABOLIC PANEL WITH GFR
ALBUMIN: 4.3 g/dL (ref 3.6–5.1)
ALT: 13 U/L (ref 6–29)
AST: 13 U/L (ref 10–35)
Alkaline Phosphatase: 76 U/L (ref 33–130)
BILIRUBIN TOTAL: 0.4 mg/dL (ref 0.2–1.2)
BUN: 15 mg/dL (ref 7–25)
CO2: 20 mmol/L (ref 20–31)
CREATININE: 0.83 mg/dL (ref 0.50–0.99)
Calcium: 9.9 mg/dL (ref 8.6–10.4)
Chloride: 103 mmol/L (ref 98–110)
GFR, EST NON AFRICAN AMERICAN: 73 mL/min (ref >=60)
GFR, Est African American: 84 mL/min (ref >=60)
GLUCOSE: 154 mg/dL — AB (ref 65–99)
Potassium: 5 mmol/L (ref 3.5–5.3)
SODIUM: 138 mmol/L (ref 135–146)
TOTAL PROTEIN: 7 g/dL (ref 6.1–8.1)

## 2017-05-22 LAB — CBC WITH DIFFERENTIAL/PLATELET
BASOS ABS: 0 {cells}/uL (ref 0–200)
Basophils Relative: 0 %
EOS ABS: 236 {cells}/uL (ref 15–500)
EOS PCT: 2 %
HCT: 39.1 % (ref 35.0–45.0)
HEMOGLOBIN: 12.9 g/dL (ref 11.7–15.5)
LYMPHS ABS: 3186 {cells}/uL (ref 850–3900)
Lymphocytes Relative: 27 %
MCH: 29.7 pg (ref 27.0–33.0)
MCHC: 33 g/dL (ref 32.0–36.0)
MCV: 89.9 fL (ref 80.0–100.0)
MPV: 10.3 fL (ref 7.5–12.5)
Monocytes Absolute: 826 cells/uL (ref 200–950)
Monocytes Relative: 7 %
NEUTROS ABS: 7552 {cells}/uL (ref 1500–7800)
Neutrophils Relative %: 64 %
Platelets: 509 10*3/uL — ABNORMAL HIGH (ref 140–400)
RBC: 4.35 MIL/uL (ref 3.80–5.10)
RDW: 13.7 % (ref 11.0–15.0)
WBC: 11.8 10*3/uL — ABNORMAL HIGH (ref 3.8–10.8)

## 2017-05-23 LAB — SEDIMENTATION RATE: Sed Rate: 4 mm/hr (ref 0–30)

## 2017-05-23 LAB — ALLERGY PANEL 18, NUT MIX GROUP
Coconut: <0.1 kU/L
Peanut IgE: <0.1 kU/L
Sesame Seed f10: <0.1 kU/L

## 2017-05-23 LAB — ALLERGEN CHOCOLATE: Allergen Chocolate f93: <0.1 kU/L

## 2017-05-23 LAB — C-REACTIVE PROTEIN: CRP: 2.1 mg/L (ref <8.0)

## 2017-05-23 LAB — EGG COMPONENT PANEL: Allergen, Ovalbumin, f232: <0.1 kU/L

## 2017-05-23 LAB — TRYPTASE: Tryptase: 5.6 ug/L (ref <11)

## 2017-05-23 LAB — ALLERGEN, BRAZIL NUT, F18: Brazil Nut: <0.1 kU/L

## 2017-05-23 LAB — ANA: ANA: NEGATIVE

## 2017-05-24 LAB — C1 ESTERASE INHIBITOR: C1INH SerPl-mCnc: 43 mg/dL — ABNORMAL HIGH (ref 21–39)

## 2017-05-25 LAB — C1 ESTERASE INHIBITOR, FUNCTIONAL: C1INH Functional/C1INH Total MFr SerPl: >100 %

## 2017-05-26 LAB — ALLERGEN, WALNUT ENGLISH, IGE
Class: 0
Walnut Food English IgE: <0.35 kU/L (ref <0.35)

## 2017-05-26 LAB — ALPHA-GAL PANEL
Beef IgE: <0.1 kU/L (ref <0.35)
CLASS: 0
CLASS: 0
Class: 0
Galactose-alpha-1,3-galactose IgE*: <0.1 kU/L (ref <0.35)

## 2017-05-27 LAB — COMPLEMENT COMPONENT C1Q: Complement C1Q: 9.4 mg/dL — ABNORMAL HIGH (ref 5.0–8.6)

## 2017-06-25 ENCOUNTER — Telehealth: Payer: Self-pay | Admitting: Allergy & Immunology

## 2017-06-25 NOTE — Telephone Encounter (Signed)
I talked to Ms. Cordial about her concerns. She reports that she has been having increased frequency of tongue swelling and hand swelling since our visit in early June. These episodes are happening around weekly at this point and she has been treating with diphenhydramine with slow improvement in symptoms (over the course of 4-6 hours). These areas of swelling are pruritic, but there are rarely if ever any urticaria present. There are no triggering events that she can see. A previously workup has been normal, including a tryptase as well as an HAE workup and food testing.   She also reports that she has been doing some reading on the internet and she thinks that she has "too much histamine". She is using on one Zyrtec daily at this time, never increasing the dose at all.   I recommended that she increase her cetirizine to 20mg  twice daily and emphasized the safety of the medication. I also reassured her that low histamine diets have never been shown She has never considered starting Xolair, but after a discussion with her about this intervention, she does report interest in this. She would like to see me as soon as she can and I offered her one of my open slots tomorrow. She will take the 2:30pm slot and will plan to show up early for her appointment.   Salvatore Marvel, MD [facility]

## 2017-06-25 NOTE — Telephone Encounter (Signed)
Please call the patient Patient has questions about mouth and tongue swelling Patient has concerns about her last visit Wants to speak to the doctor directly about issues

## 2017-06-26 ENCOUNTER — Ambulatory Visit: Payer: Managed Care, Other (non HMO) | Admitting: Allergy & Immunology

## 2017-06-26 NOTE — Telephone Encounter (Signed)
Called patient. I informed her that I was mailing out some information on Xolair.

## 2017-06-26 NOTE — Telephone Encounter (Signed)
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 She is going to come in next week instead. Could someone send her information on Xolair for chronic urticaria? Thanks!   Salvatore Marvel, MD [facility]

## 2017-07-03 ENCOUNTER — Encounter: Payer: Self-pay | Admitting: Allergy & Immunology

## 2017-07-03 ENCOUNTER — Ambulatory Visit (INDEPENDENT_AMBULATORY_CARE_PROVIDER_SITE_OTHER): Payer: Medicare Other | Admitting: Allergy & Immunology

## 2017-07-03 VITALS — BP 114/62 | HR 84 | Resp 16

## 2017-07-03 DIAGNOSIS — R220 Localized swelling, mass and lump, head: Secondary | ICD-10-CM | POA: Diagnosis not present

## 2017-07-03 DIAGNOSIS — J449 Chronic obstructive pulmonary disease, unspecified: Secondary | ICD-10-CM

## 2017-07-03 DIAGNOSIS — T783XXD Angioneurotic edema, subsequent encounter: Secondary | ICD-10-CM

## 2017-07-03 NOTE — Progress Notes (Signed)
FOLLOW UP  Date of Service/Encounter:  07/03/17   Assessment:   Tongue swelling and angioedema - with multiple triggers (pepperoni, alcohol, trauma)  Chronic obstructive pulmonary disease   Plan/Recommendations:   1. Angioedema - unknown trigger - Start cetirizine (Zyrtec) two tablets in the morning and two tablets at night to suppress future episodes.  - I recommended that Ms. Alford Highland purchase over the [employer] name brand at [employer]. - We printed off the labs for you to have and we will make copies so that Dr. Rex Kras gets them as well.  - We did discuss the possibility of changing her blood pressure medication, as ARBs do have a possibility of triggering angioedema, although not quite as prevalent as ACEI.  - I recommended that Ms. Weilbacher discuss this possibility with Dr. Rex Kras at their next appointment. - One additional possibility is that she has type 3 HAE, which has normal C1 esterase levels and function but in the classical clinical presentation of type 1 and 2 HAE. - Type 3 HAE is caused by an unknown mutation, but it thought to be hormonally-mediated and/or related to Factor XII mutations. - Type 3 HAE is unfortunately a diagnosis of exclusion, therefore we can revisit this is the suppressive dosing of antihistamines does not decrease the frequency of symptoms.   2. COPD - with current tobacco exposure - Lung function did not change since the last visit.  - This is likely because she has not been using her Symbicort at all. - She is still asymptomatic per review of systems (although I think that she is not entire aware of symptoms), but her spirometry does not look great. - I still think that she could give the Symbicort a try to see if this can improve her pulmonary symptoms.  - I did convince her to use the Symbicort at least two puffs once daily. - Free spacer and another Symbicort sample provided.  - Briefly discussed smoking cessions and emphasized that  stopping cigarettes at this point can change her clinical trajectory.   3. Return in about 6 months (around 01/03/2018).  Subjective:   Traci Berger is a 66 y.o. female presenting today for follow up of  Chief Complaint  Patient presents with  . Follow-up    Traci Berger has a history of the following: Patient Active Problem List   Diagnosis Date Noted  . Coronary artery calcification seen on CAT scan 02/08/2016  . Chronic obstructive pulmonary disease ([place]) 02/05/2016  . Essential (primary) hypertension 02/05/2016  . Microalbuminuria 02/05/2016  . Combined fat and carbohydrate induced hyperlipemia 02/05/2016  . Other specified disorders of bone density and structure, unspecified site 02/05/2016  . History of colon polyps 02/05/2016  . Pure hypercholesterolemia 02/05/2016  . Tubular adenoma of colon 02/05/2016  . Disorder associated with type 2 diabetes mellitus ([place]) 02/05/2016  . Abnormal findings on microbiological examination of urine 10/20/2015  . Encounter for general adult medical examination without abnormal findings 10/20/2015  . Wax in ear 10/20/2015  . Bladder infection, chronic 03/21/2014  . Calculus of kidney 11/26/2013  . Chronic interstitial cystitis 11/08/2013  . Current tobacco use 11/08/2013  . Type 2 diabetes mellitus ([place]) 11/08/2013  . Calculi, ureter 10/14/2013  . Flank pain 10/11/2013  . Blood in the urine 10/11/2013    History obtained from: chart review and patient.  Traci Berger was referred by Traci Fess, MD.     Traci Berger is a 66 y.o. female presenting for a follow  up visit. She was last seen in June 2018 for evaluation of tongue swelling. There was no clear trigger on her history. We did perform environmental allergy testing that was completely negative, although we did not do interdermal's. We did look at the most common foods and there was a slight reactivity to egg. We obtained an extensive lab workup for swelling that showed  a normal complete blood count, metabolic panel, a component panel, tree nut panel, chocolate IgE, and a normal hereditary angioedema workup. We will also look at inflammatory markers as well as serum tryptase. Both of these are normal as well. An alpha gal panel was also normal. Her swelling was responsive to antihistamine, therefore I felt that her swelling was histaminergic in nature. She also had irritant contact dermatitis which I felt was secondary to her moisturizer. She carries a diagnosis of COPD, and her spirometry looked abnormal. We started her on Symbicort 160/4.52 puffs twice daily. Of note, her absolute eosinophil count was 236, making her a candidate for anti-IL-5 therapy if needed.  Since the last visit, she has not done well. She has several complaints today, including the price of charges from the last visit. She is especially infuriated with the costs of the labs - reportedly this was $600. She is not sure which lab cost the most money but she is not pleased with this at all today. She has also had a couple of additional swelling episodes since the last visit, one of which was fairly severe and included her entire face. She did not need to use her EpiPen and did not go to the ED. It seems that she did get prednisone and the swelling resolved over the course of 48-72 hours with the help of Benadryl. She does feel that the Benadryl provides quite a bit help during the swelling episodes and she does report that the swelling is pruritic, although she also reports that she can "feel" it coming along with tingling and pain. She had ingested beer and pepperoni pizza prior to the onset of this most severe swelling episode, both which she had tolerated in the past. Interestingly, one of the episodes was triggered by a vase falling on her hand, which resulted in localized swelling of her hand and spreading to her arm.   Ms. Nephew did do some research on her own and feels that she has "too much  histamine". She brings in some printed materials from a naturopathic web site, which has ideas for low histamine foods and whatnot. She did read over the [place] brochure and is refusing to even attempt a trial of this. She has only been using one cetirizine daily and has not tried taking extra doses to keep the symptoms at [place]. She has discussed changing her blood pressure medication with Dr. Rex Kras, but she remains on the ARB at this point. Her blood pressure has been under good control, therefore she is hesitant to change it.    Otherwise, there have been no changes to her past medical history, surgical history, family history, or social history.    Review of Systems: a 14-point review of systems is pertinent for what is mentioned in HPI.  Otherwise, all other systems were negative. Constitutional: negative other than that listed in the HPI Eyes: negative other than that listed in the HPI Ears, nose, mouth, throat, and face: negative other than that listed in the HPI Respiratory: negative other than that listed in the HPI Cardiovascular: negative other than that listed in the  HPI Gastrointestinal: negative other than that listed in the HPI Genitourinary: negative other than that listed in the HPI Integument: negative other than that listed in the HPI Hematologic: negative other than that listed in the HPI Musculoskeletal: negative other than that listed in the HPI Neurological: negative other than that listed in the HPI Allergy/Immunologic: negative other than that listed in the HPI    Objective:   Blood pressure 114/62, pulse 84, resp. rate 16. There is no height or weight on file to calculate BMI.   Physical Exam:  General: Alert, interactive, in no acute distress. Mostly pleasant female.  Eyes: No conjunctival injection present on the right, No conjunctival injection present on the left, PERRL bilaterally, No discharge on the right, No discharge on the left and No Horner-Trantas  dots present Ears: Right TM pearly gray with normal light reflex, Left TM pearly gray with normal light reflex, Right TM intact without perforation and Left TM intact without perforation.  Nose/Throat: External nose within normal limits and septum midline, turbinates edematous with clear discharge, post-pharynx erythematous without cobblestoning in the posterior oropharynx. Tonsils 2+ without exudates Neck: Supple without thyromegaly. Lungs: Clear to auscultation without wheezing, rhonchi or rales. No increased work of breathing. CV: Normal S1/S2, no murmurs. Capillary refill <2 seconds.  Skin: Warm and dry, without lesions or rashes. Neuro:   Grossly intact. No focal deficits appreciated. Responsive to questions.   Diagnostic studies:   Spirometry: results abnormal (FEV1: 1.44/71%, FVC: 1.70/67%, FEV1/FVC: 84%).    Spirometry consistent with possible restrictive disease. Overall values remain stable since the last visit.   Allergy Studies: none      Salvatore Marvel, MD [facility]

## 2017-07-03 NOTE — Patient Instructions (Signed)
1. Angioedema - unknown trigger - Start cetirizine (Zyrtec) two tablets in the morning and two tablets at night. - Get the over the counter generic name brand at [facility]. - We printed off the labs for you to have and we will make copies so that Dr. Rex Kras gets them as well.  - You might consider changing your blood pressure medication to see if this could help. - You can discuss with Dr. Rex Kras about your blood pressure medication.  2. COPD - Lung function did not change.  - Try using Symbicort at least two puffs once daily.  3. Return in about 6 months (around 01/03/2018).   Please inform us of any Emergency Department visits, hospitalizations, or changes in symptoms. Call us before going to the ED for breathing or allergy symptoms since we might be able to fit you in for a sick visit. Feel free to contact us anytime with any questions, problems, or concerns.  It was a pleasure to see you again today! Happy summer!   Websites that have reliable patient information: 1. American Academy of Asthma, Allergy, and Immunology: www.aaaai.org 2. Food Allergy Research and Education (FARE): foodallergy.org 3. Mothers of Asthmatics: http://www.asthmacommunitynetwork.org 4. American College of Allergy, Asthma, and Immunology: www.acaai.org

## 2017-07-04 NOTE — Addendum Note (Signed)
Addended by: Gara Kroner L on: 07/04/2017 04:03 PM   Modules accepted: Orders

## 2017-07-16 ENCOUNTER — Ambulatory Visit: Payer: Managed Care, Other (non HMO) | Admitting: Allergy & Immunology

## 2017-10-10 ENCOUNTER — Telehealth: Payer: Self-pay | Admitting: Allergy & Immunology

## 2017-10-10 NOTE — Telephone Encounter (Signed)
Patient called asking about her medication regimen. She stated that her PCP said that he thought taking two zyrtec in morning and two at night was excessive. She is wanting to know if that is what she should be taking. According to Dr. Gillermina Hu office note on 07/03/2017 patient is to start taking cetirizine (Zyrtec) two tablets in the morning and two tablets at night to suppress future episodes of angioedema. Is this correct? Please advise.

## 2017-10-13 NOTE — Telephone Encounter (Signed)
I am sending consent and information.

## 2017-10-13 NOTE — Telephone Encounter (Signed)
I called Traci Berger to discuss her concerns. She reports that she was only taking cetirizine one tablet twice daily instead of the recommended two tablets twice daily. In any case, she stopped taking the cetirizine completely because she continued to have swelling episodes of her face despite the antihistamine, including her tongue. However, she never had problems breathing and never needed to use her epinephrine. She is not taking any antihistamines at this point aside from the Benadryl when the reactions occur. She did have an episode of swelling last week and went to see Dr. Rex Kras, where she received a prednisone injection with immediate resolution of symptoms. Evidently, she also had her blood pressure medication change to amlodipine from irbesartan-HCTZ due to concern that the swelling was related to the blood ARB.   However, there are several things in her history that go against a bradykinin-mediated process (the swelling episodes triggered by ACEI/ARBs):  1. Improvement with steroids. 2. Improvement with antihistamines, including Benadryl. 3. Itching associated with some of the episodes.  Because of this, I continue to believe that this is a histmainergic process. Therefore I would recommend starting therapy with Xolair to see if this can help the episodes of swelling. Although she has only had urticaria on an isolated number of occasions, this is still sounds like a histaminergic process and Xolair could certainly provide some relief. We will send information on Xolair to Ms. Pensyl so that she can look into this. She is also going to restart her cetirizine at 20mg  twice daily to see if this can help in the interim.   If she does start Xolair and improve with regards to frequency of swelling episodes, I think it would be safe to reconsider the use of the ARB-HCTZ, as Ms. Brugh reported better blood pressures with that medication compared to amlodipine.   Salvatore Marvel, MD Allergy and  [facility] [place]

## 2017-10-14 NOTE — Telephone Encounter (Signed)
Called and discussed with patient Xolair and possible help for her symptoms.  I am going to send her paperwork to fill out and return to me to hopefully get drug through foundation so it will be affordable.  According to her income level she should have no problem getting assistance.

## 2017-10-14 NOTE — Telephone Encounter (Signed)
Thank you for providing such excellent care to our patients!   Salvatore Marvel, MD Allergy and [facility]

## 2017-10-14 NOTE — Telephone Encounter (Signed)
L/m for patient to contact me to discuss Xolair and cost

## 2017-10-15 ENCOUNTER — Telehealth: Payer: Self-pay | Admitting: Allergy & Immunology

## 2017-10-15 NOTE — Telephone Encounter (Signed)
Patient called back at 220 to advise she was no better despite taking the 2 benadryl. I reviewed the notes again and spoke with Dr. Nelva Bush. She advised to go ahead with 2 Zyrtec now along with either 1tab/cap of Pepcid or Zantac. Then if needed to take 1 Zyrtec at bedtime. Patient acknowledged understanding and stated that she would have to wait until her husband got home for the Pepcid/Zantac, and that would be a couple of hours. I told her to start the 2 Zyrtec in the morning and 2 Zyrtec at night. She did tell me that she had to bring her mom over this way tomorrow and wanted to know if she could "drop in" and let Dr. Ernst Bowler see her face. I explained that if she was better in the morning then a call to up date would be okay. She said okay. I explained to her that I would call her around 8 am tomorrow to see how she is and to see if a OV or "drop in" is warranted.

## 2017-10-15 NOTE — Telephone Encounter (Signed)
Patient called and said Dr. Ernst Bowler told her if her swelling returned, to give [place] a call. She is swelling in her forehead and eyelid.

## 2017-10-15 NOTE — Telephone Encounter (Signed)
I called patient back and went over advisements from the last office visit. She is not taking the 2 Zyrtec 2 times daily. She is going to start that in the morning. She did take 2 benadryl this morning at 930 am. She stated that does usually work within a couple of hours. She will call me or another nurse back after lunch to let us know how she is doing.

## 2017-10-16 ENCOUNTER — Ambulatory Visit (INDEPENDENT_AMBULATORY_CARE_PROVIDER_SITE_OTHER): Payer: Medicare Other | Admitting: Allergy & Immunology

## 2017-10-16 ENCOUNTER — Encounter: Payer: Self-pay | Admitting: Allergy & Immunology

## 2017-10-16 ENCOUNTER — Telehealth: Payer: Self-pay | Admitting: Allergy & Immunology

## 2017-10-16 VITALS — BP 130/75 | HR 86 | Temp 98.3°F | Resp 17

## 2017-10-16 DIAGNOSIS — T783XXD Angioneurotic edema, subsequent encounter: Secondary | ICD-10-CM | POA: Diagnosis not present

## 2017-10-16 NOTE — Telephone Encounter (Signed)
Patient called and only wants to talk to [facility].

## 2017-10-16 NOTE — Telephone Encounter (Signed)
Patient called back and wants to only talk to [facility].

## 2017-10-16 NOTE — Progress Notes (Signed)
FOLLOW UP  Date of Service/Encounter:  10/17/17   Assessment:   Angioedema with urticaria - unknown trigger  COPD - did not address today    Plan/Recommendations:   1. Angioedema with urticaria - unknown trigger - I reaffirmed the plan today to start cetirizine (Zyrtec) two tablets in the morning and two tablets at night. - I also reaffirmed that it did not sound like her previous blood pressure medication was related given her clinical symptoms (presence of pruritis and hives as well as marked response to antihistamines).  - One additional possibility that we can continue to consider is that she has type 3 HAE, which has normal C1 esterase levels and function but in the classical clinical presentation of type 1 and 2 HAE. - Type 3 HAE is caused by an unknown mutation, but it thought to be hormonally-mediated and/or related to Factor XII mutations. - Type 3 HAE is unfortunately a diagnosis of exclusion, therefore we can revisit this is the suppressive dosing of antihistamines does not decrease the frequency of symptoms.  - She has not trialed suppressive antihistamines, therefore this should be ruled out first. - In addition, type 3 HAE is also less likely due to the presence of pruritis and hives as well as marked response to antihistamines.  - This is clearly a histaminergic process, therefore we will consider Xolair as a next step. - Information on Xolair has previously been provided and Traci Berger is running the numbers.   - I did recommend that she restart her previous BP medication (ARB-HCTZ) since it provided better control and her history is no longer suggestive of ACE/ARB induced angioedema.   2. Return in about 3 months (around 01/16/2018).      Subjective:   Traci Berger is a 66 y.o. female presenting today for follow up of  Chief Complaint  Patient presents with  . Angioedema    Traci Berger has a history of the following: Patient Active Problem List   Diagnosis Date Noted  . Coronary artery calcification seen on CAT scan 02/08/2016  . Chronic obstructive pulmonary disease ([place]) 02/05/2016  . Essential (primary) hypertension 02/05/2016  . Microalbuminuria 02/05/2016  . Combined fat and carbohydrate induced hyperlipemia 02/05/2016  . Other specified disorders of bone density and structure, unspecified site 02/05/2016  . History of colon polyps 02/05/2016  . Pure hypercholesterolemia 02/05/2016  . Tubular adenoma of colon 02/05/2016  . Disorder associated with type 2 diabetes mellitus ([place]) 02/05/2016  . Abnormal findings on microbiological examination of urine 10/20/2015  . Encounter for general adult medical examination without abnormal findings 10/20/2015  . Wax in ear 10/20/2015  . Bladder infection, chronic 03/21/2014  . Calculus of kidney 11/26/2013  . Chronic interstitial cystitis 11/08/2013  . Current tobacco use 11/08/2013  . Type 2 diabetes mellitus ([place]) 11/08/2013  . Calculi, ureter 10/14/2013  . Flank pain 10/11/2013  . Blood in the urine 10/11/2013    History obtained from: chart review and patient.  Traci Berger's Primary Care Provider is Traci Fess, MD.     [name] is a 66 y.o. female presenting for a sick visit. [name] is a 66 y.o. female presenting for a follow up visit. She was first seen in June 2018 for evaluation of tongue swelling. There was no clear trigger on her history. We did perform environmental allergy testing that was completely negative, although we did not do interdermals. We did look at the most common foods and there was a slight reactivity  to egg. We obtained an extensive lab workup for swelling that showed a normal complete blood count, metabolic panel, a component panel, tree nut panel, chocolate IgE, and a normal hereditary angioedema workup. We also looked at inflammatory markers as well as serum tryptase. Both of these are normal as well. An alpha gal panel was also normal. Her swelling  was responsive to antihistamine, therefore I felt that her swelling was histaminergic in nature. She carries a diagnosis of COPD, and her spirometry looked abnormal. We started her on Symbicort 160/4.5 two puffs twice daily. Of note, her absolute eosinophil count was 236, making her a candidate for anti-IL-5 therapy if needed.  In the interim, we have talked on the phone and I recommended that she definitely start the cetirizine two tablets twice daily. We also discussed Xolair and I sent information on the biologic and discussed its mechanism of action. Although urticaria is not a predominant symptom for her, I continue to think that Xolair would be helpful for her. She comes in today so that I can evaluate her eye swelling in person. It has improved overall and she seems most concerned about the swelling around her eyes. She did have hives on her hands and feet, but these have already resolved. She is going to start the suppressive antihistamines, and has purchased cetirizine at [facility] as recommended.   Of note, her PCP did change her BP medication on my recommendation. She is now on Norvasc and endorses worse BP control and increased incidence of swelling episodes.   Otherwise, there have been no changes to her past medical history, surgical history, family history, or social history.    Review of Systems: a 14-point review of systems is pertinent for what is mentioned in HPI.  Otherwise, all other systems were negative. Constitutional: negative other than that listed in the HPI Eyes: negative other than that listed in the HPI Ears, nose, mouth, throat, and face: negative other than that listed in the HPI Respiratory: negative other than that listed in the HPI Cardiovascular: negative other than that listed in the HPI Gastrointestinal: negative other than that listed in the HPI Genitourinary: negative other than that listed in the HPI Integument: negative other than that listed in the  HPI Hematologic: negative other than that listed in the HPI Musculoskeletal: negative other than that listed in the HPI Neurological: negative other than that listed in the HPI Allergy/Immunologic: negative other than that listed in the HPI    Objective:   Blood pressure 130/75, pulse 86, temperature 98.3 F (36.8 C), temperature source Oral, resp. rate 17, SpO2 96 %. There is no height or weight on file to calculate BMI.   Physical Exam:  General: Alert, interactive, in no acute distress. Somewhat terse.  Eyes: Bilateral periorbital edema with some swelling noted on the frontal region of the head, No conjunctival injection bilaterally, no discharge on the right, no discharge on the left, no Horner-Trantas dots present and allergic shiners present bilaterally. PERRL bilaterally. EOMI without pain. No photophobia. Lungs: Clear to auscultation without wheezing, rhonchi or rales. No increased work of breathing. CV: Normal S1/S2. No murmurs. Capillary refill <2 seconds.  Skin: Warm and dry, without lesions or rashes. Neuro:   Grossly intact. No focal deficits appreciated. Responsive to questions.     Diagnostic studies: none      Salvatore Marvel, MD [facility]

## 2017-10-16 NOTE — Telephone Encounter (Signed)
I called patient and she stated that the swelling has now moved from her forehead to under eyes. I spoke with Dr. Ernst Bowler and the patient is coming to see him today.

## 2017-10-16 NOTE — Telephone Encounter (Signed)
Called patient back at 758 am as promised. She did not answer. I left a message advising her to call back after 830 to let me know how the swelling was doing after the recommended treatment. I did tell her that if I was unavailable another nurse/cma could take down her concerns and forward them to the doctor.

## 2017-10-17 ENCOUNTER — Encounter: Payer: Self-pay | Admitting: Allergy & Immunology

## 2017-10-17 NOTE — Patient Instructions (Addendum)
1. Angioedema - unknown trigger - I reaffirmed the plan today to start cetirizine (Zyrtec) two tablets in the morning and two tablets at night. - I also reaffirmed that it did not sound like her previous blood pressure medication was related given her clinical symptoms (presence of pruritis and hives as well as marked response to antihistamines).   2. Return in about 3 months (around 01/16/2018).

## 2017-11-13 ENCOUNTER — Other Ambulatory Visit: Payer: Self-pay | Admitting: Acute Care

## 2017-11-13 DIAGNOSIS — Z122 Encounter for screening for malignant neoplasm of respiratory organs: Secondary | ICD-10-CM

## 2017-11-13 DIAGNOSIS — F17210 Nicotine dependence, cigarettes, uncomplicated: Secondary | ICD-10-CM

## 2017-12-24 ENCOUNTER — Telehealth: Payer: Self-pay | Admitting: *Deleted

## 2017-12-24 NOTE — Telephone Encounter (Signed)
I talked to patient several weeks and mailed patient info/ consent to submit for patient assistance for Xolair but have not received same back yet. I will assume that she is no longer interested since I have not heard from her or received required info.  If patient decides she wants to start therapy she can get me a call.

## 2017-12-26 ENCOUNTER — Ambulatory Visit: Payer: Managed Care, Other (non HMO)

## 2017-12-26 ENCOUNTER — Ambulatory Visit
Admission: RE | Admit: 2017-12-26 | Discharge: 2017-12-26 | Disposition: A | Discharge status: Home / Self Care | Payer: Medicare Other | Source: Ambulatory Visit | Attending: Acute Care | Admitting: Acute Care

## 2017-12-26 DIAGNOSIS — F17210 Nicotine dependence, cigarettes, uncomplicated: Secondary | ICD-10-CM

## 2017-12-26 DIAGNOSIS — Z122 Encounter for screening for malignant neoplasm of respiratory organs: Secondary | ICD-10-CM

## 2018-01-01 ENCOUNTER — Other Ambulatory Visit: Payer: Self-pay | Admitting: Acute Care

## 2018-01-01 DIAGNOSIS — Z122 Encounter for screening for malignant neoplasm of respiratory organs: Secondary | ICD-10-CM

## 2018-01-01 DIAGNOSIS — F17210 Nicotine dependence, cigarettes, uncomplicated: Secondary | ICD-10-CM

## 2018-04-13 ENCOUNTER — Other Ambulatory Visit: Payer: Self-pay | Admitting: Family Medicine

## 2018-04-13 DIAGNOSIS — E0789 Other specified disorders of thyroid: Secondary | ICD-10-CM

## 2018-04-13 DIAGNOSIS — E079 Disorder of thyroid, unspecified: Secondary | ICD-10-CM

## 2018-05-05 ENCOUNTER — Other Ambulatory Visit: Payer: Medicare Other

## 2018-05-06 ENCOUNTER — Ambulatory Visit
Admission: RE | Admit: 2018-05-06 | Discharge: 2018-05-06 | Disposition: A | Discharge status: Home / Self Care | Payer: Medicare Other | Source: Ambulatory Visit | Attending: Family Medicine | Admitting: Family Medicine

## 2018-05-06 DIAGNOSIS — E0789 Other specified disorders of thyroid: Secondary | ICD-10-CM

## 2018-05-06 DIAGNOSIS — E079 Disorder of thyroid, unspecified: Secondary | ICD-10-CM

## 2018-06-22 ENCOUNTER — Encounter: Payer: Self-pay | Admitting: *Deleted

## 2018-06-23 ENCOUNTER — Other Ambulatory Visit: Payer: Self-pay | Admitting: Endocrinology

## 2018-06-23 DIAGNOSIS — E041 Nontoxic single thyroid nodule: Secondary | ICD-10-CM

## 2018-07-01 ENCOUNTER — Ambulatory Visit
Admission: RE | Admit: 2018-07-01 | Discharge: 2018-07-01 | Disposition: A | Discharge status: Home / Self Care | Payer: Medicare Other | Source: Ambulatory Visit | Attending: Endocrinology | Admitting: Endocrinology

## 2018-07-01 ENCOUNTER — Other Ambulatory Visit (HOSPITAL_COMMUNITY)
Admission: RE | Admit: 2018-07-01 | Discharge: 2018-07-01 | Disposition: A | Discharge status: Home / Self Care | Payer: Medicare Other | Source: Ambulatory Visit | Attending: Student | Admitting: Student

## 2018-07-01 DIAGNOSIS — E041 Nontoxic single thyroid nodule: Secondary | ICD-10-CM | POA: Insufficient documentation

## 2018-07-03 ENCOUNTER — Other Ambulatory Visit: Payer: Self-pay | Admitting: Cardiology

## 2018-07-03 ENCOUNTER — Telehealth: Payer: Self-pay | Admitting: *Deleted

## 2018-07-03 DIAGNOSIS — I2510 Atherosclerotic heart disease of native coronary artery without angina pectoris: Secondary | ICD-10-CM

## 2018-07-03 DIAGNOSIS — E7800 Pure hypercholesterolemia, unspecified: Secondary | ICD-10-CM

## 2018-07-03 MED ORDER — EZETIMIBE 10 MG PO TABS: 10.0000 mg | ORAL_TABLET | Freq: Every day | ORAL | 1 refills | Status: DC

## 2018-07-03 NOTE — Telephone Encounter (Signed)
Spoke with the pt and informed her that per Dr Radford Pax, she reviewed her scanned labs and her LDL is not at goal and she recommends that we add Zetia 10 mg po daily to her regimen, and recheck lipids and ALT in 6 weeks.  Confirmed the pharmacy of choice with the pt.  Scheduled the pt for repeat labs on 08/18/18, as pt requested this lab date. Pt is aware to come fasting to this lab appt.  Pt verbalized understanding and agrees with this plan.

## 2018-07-03 NOTE — Telephone Encounter (Signed)
-----   Message from Sueanne Margarita, MD sent at 07/03/2018 10:38 AM EDT ----- LDL is not at goal please add Zetia 10 mg daily and repeat FLP and ALT in 6 weeks

## 2018-07-20 ENCOUNTER — Encounter: Payer: Self-pay | Admitting: Cardiology

## 2018-07-20 ENCOUNTER — Ambulatory Visit: Payer: Medicare Other | Admitting: Cardiology

## 2018-07-20 VITALS — BP 136/68 | HR 75 | Ht 62.0 in | Wt 164.2 lb

## 2018-07-20 DIAGNOSIS — E7800 Pure hypercholesterolemia, unspecified: Secondary | ICD-10-CM

## 2018-07-20 DIAGNOSIS — I10 Essential (primary) hypertension: Secondary | ICD-10-CM

## 2018-07-20 DIAGNOSIS — I2510 Atherosclerotic heart disease of native coronary artery without angina pectoris: Secondary | ICD-10-CM | POA: Diagnosis not present

## 2018-07-20 NOTE — Progress Notes (Signed)
Cardiology Office Note:    Date:  07/20/2018   ID:  Traci Berger, DOB [birth year], MRN [id number]  PCP:  Hulan Fess, MD  Cardiologist:  No primary care provider on file.    Referring MD: Hulan Fess, MD   Chief Complaint  Patient presents with  . Coronary Artery Disease  . Hypertension  . Hyperlipidemia    History of Present Illness:    Traci Berger is a 67 y.o. female with a hx of coronary artery calcifications noted on Chest CT done for lung screening. Nuclear stress test showed no ischemia . She has a history of dyslipidemia, HTN and type 2 DM. There is no family history of CAD. She is here today for followup and is doing well.  She denies any chest pain or pressure, SOB, DOE, PND, orthopnea, LE edema, dizziness, palpitations or syncope. She is compliant with her meds and is tolerating meds with no SE.    Past Medical History:  Diagnosis Date  . Coronary artery calcification seen on CAT scan 02/08/2016  . Diabetes mellitus without complication ([place])   . Hyperlipidemia   . Hypertension   . Interstitial cystitis   . Kidney stones   . Osteopenia     No past surgical history on file.  Current Medications: Current Meds  Medication Sig  . amLODipine (NORVASC) 5 MG tablet TK 1 T PO QD  . aspirin EC 81 MG tablet Take 81 mg by mouth daily.  . budesonide-formoterol (SYMBICORT) 160-4.5 MCG/ACT inhaler Inhale 2 puffs into the lungs 2 (two) times daily.  . Cholecalciferol (VITAMIN D) 2000 units CAPS Take 2,000 Units by mouth daily.  . diphenhydrAMINE (BENADRYL) 25 mg capsule Take 25 mg by mouth every 6 (six) hours as needed.  Marland Kitchen EPINEPHrine 0.3 mg/0.3 mL IJ SOAJ injection Inject into the skin.  Marland Kitchen ezetimibe (ZETIA) 10 MG tablet Take 1 tablet (10 mg total) by mouth daily.  Marland Kitchen glimepiride (AMARYL) 2 MG tablet Take 2 mg by mouth 2 (two) times daily.  . hydrochlorothiazide (MICROZIDE) 12.5 MG capsule TK 1 C PO QD IN THE MORNING  . irbesartan-hydrochlorothiazide (AVALIDE)  300-12.5 MG tablet Take 1 tablet by mouth daily.  . metFORMIN (GLUCOPHAGE) 1000 MG tablet Take 1,000 mg by mouth 2 (two) times daily.  . pravastatin (PRAVACHOL) 80 MG tablet Take 80 mg by mouth daily.  . timolol (TIMOPTIC) 0.5 % ophthalmic solution INT 1 GTT IN OU BID     Allergies:   Other; Sulfa antibiotics; Sulfacetamide sodium; Macrobid  [nitrofurantoin monohyd macro]; Nitrofurantoin macrocrystal; Ramipril; and Simvastatin   Social History   Socioeconomic History  . Marital status: Married    Spouse name: Not on file  . Number of children: Not on file  . Years of education: Not on file  . Highest education level: Not on file  Occupational History  . Not on file  Social Needs  . Financial resource strain: Not on file  . Food insecurity:    Worry: Not on file    Inability: Not on file  . Transportation needs:    Medical: Not on file    Non-medical: Not on file  Tobacco Use  . Smoking status: Current Every Day Smoker    Packs/day: 1.00    Years: 35.00    Pack years: 35.00    Types: Cigarettes  . Smokeless tobacco: Never Used  . Tobacco comment: Counseled to quit smoking.  Substance and Sexual Activity  . Alcohol use: No    Alcohol/week:  0.0 standard drinks  . Drug use: No  . Sexual activity: Not on file  Lifestyle  . Physical activity:    Days per week: Not on file    Minutes per session: Not on file  . Stress: Not on file  Relationships  . Social connections:    Talks on phone: Not on file    Gets together: Not on file    Attends religious service: Not on file    Active member of club or organization: Not on file    Attends meetings of clubs or organizations: Not on file    Relationship status: Not on file  Other Topics Concern  . Not on file  Social History Narrative  . Not on file     Family History: The patient's family history includes Arrhythmia in her mother; Colon cancer in her father. There is no history of Allergic rhinitis, Angioedema, Asthma,  Eczema, Immunodeficiency, or Urticaria.  ROS:   Please see the history of present illness.    ROS  All other systems reviewed and negative.   EKGs/Labs/Other Studies Reviewed:    The following studies were reviewed today: none  EKG:  EKG is  ordered today.  The ekg ordered today demonstrates NSR with no ST change  Recent Labs: No results found for requested labs within last 8760 hours.   Recent Lipid Panel No results found for: CHOL, TRIG, HDL, CHOLHDL, VLDL, LDLCALC, LDLDIRECT  Physical Exam:    VS:  There were no vitals taken for this visit.    Wt Readings from Last 3 Encounters:  05/15/17 163 lb 9.6 oz (74.2 kg)  04/24/17 163 lb (73.9 kg)  02/08/16 166 lb (75.3 kg)     GEN:  Well nourished, well developed in no acute distress HEENT: Normal NECK: No JVD; No carotid bruits LYMPHATICS: No lymphadenopathy CARDIAC: RRR, no murmurs, rubs, gallops RESPIRATORY:  Clear to auscultation without rales, wheezing or rhonchi  ABDOMEN: Soft, non-tender, non-distended MUSCULOSKELETAL:  No edema; No deformity  SKIN: Warm and dry NEUROLOGIC:  Alert and oriented x 3 PSYCHIATRIC:  Normal affect   ASSESSMENT:    1. Coronary artery calcification seen on CAT scan   2. Essential (primary) hypertension   3. Pure hypercholesterolemia    PLAN:    In order of problems listed above:  1.  ASCAD -  coronary artery calcifications noted on Chest CT done for lung screening. Nuclear stress test showed no ischemia.  She denies any anginal symptoms.  She will continue on aspirin 81 mg daily, pravastatin 80 mg daily.  2.  HTN -BP is well controlled on exam today.  She will continue on amlodipine 5 mg daily, HCTZ 12.5 mg daily, Avalide 300-12.5 mg daily.  Creatinine was normal at 0.83 on 05/22/2017 potassium was 5.  3.  Hyperlipidemia -we will goal is less than 70.  She will continue on Pravachol 80 mg daily.  Her last LDL was 85 on 06/10/2018.  She started Zetia 10 mg daily last week so I will get  an FLP and ALT in 6 weeks.   Medication Adjustments/Labs and Tests Ordered: Current medicines are reviewed at length with the patient today.  Concerns regarding medicines are outlined above.  No orders of the defined types were placed in this encounter.  No orders of the defined types were placed in this encounter.   Signed, Fransico Him, MD  07/20/2018 11:41 AM    [place]

## 2018-07-20 NOTE — Patient Instructions (Signed)
Medication Instructions:  Your physician recommends that you continue on your current medications as directed. Please refer to the Current Medication list given to you today.   Labwork: Your lab appointment for FASTING LIPIDS and LFTS has been re-scheduled for 09/01/18  Testing/Procedures: None ordered  Follow-Up: Your physician wants you to follow-up in: 1 year with Dr. Radford Pax. You will receive a reminder letter in the mail two months in advance. If you don't receive a letter, please call our office to schedule the follow-up appointment.   Any Other Special Instructions Will Be Listed Below (If Applicable).     If you need a refill on your cardiac medications before your next appointment, please call your pharmacy.

## 2018-07-29 ENCOUNTER — Encounter (HOSPITAL_COMMUNITY): Payer: Self-pay

## 2018-08-18 ENCOUNTER — Other Ambulatory Visit: Payer: Medicare Other

## 2018-09-01 ENCOUNTER — Other Ambulatory Visit: Payer: Medicare Other

## 2018-09-01 DIAGNOSIS — I10 Essential (primary) hypertension: Secondary | ICD-10-CM

## 2018-09-01 DIAGNOSIS — E7800 Pure hypercholesterolemia, unspecified: Secondary | ICD-10-CM

## 2018-09-01 DIAGNOSIS — I2510 Atherosclerotic heart disease of native coronary artery without angina pectoris: Secondary | ICD-10-CM

## 2018-09-01 LAB — HEPATIC FUNCTION PANEL
ALT: 16 IU/L (ref 0–32)
AST: 15 IU/L (ref 0–40)
Albumin: 4.4 g/dL (ref 3.6–4.8)
Alkaline Phosphatase: 87 IU/L (ref 39–117)
BILIRUBIN TOTAL: 0.2 mg/dL (ref 0.0–1.2)
BILIRUBIN, DIRECT: 0.08 mg/dL (ref 0.00–0.40)
Total Protein: 6.7 g/dL (ref 6.0–8.5)

## 2018-09-01 LAB — LIPID PANEL
CHOL/HDL RATIO: 4 ratio (ref 0.0–4.4)
CHOLESTEROL TOTAL: 171 mg/dL (ref 100–199)
HDL: 43 mg/dL (ref >39)
LDL Calculated: 95 mg/dL (ref 0–99)
TRIGLYCERIDES: 165 mg/dL — AB (ref 0–149)
VLDL Cholesterol Cal: 33 mg/dL (ref 5–40)

## 2018-09-04 ENCOUNTER — Telehealth: Payer: Self-pay

## 2018-09-04 ENCOUNTER — Other Ambulatory Visit: Payer: Self-pay | Admitting: Cardiology

## 2018-09-04 DIAGNOSIS — E7800 Pure hypercholesterolemia, unspecified: Secondary | ICD-10-CM

## 2018-09-04 MED ORDER — ROSUVASTATIN CALCIUM 40 MG PO TABS: 40.0000 mg | ORAL_TABLET | Freq: Every day | ORAL | 0 refills | Status: DC

## 2018-09-04 NOTE — Telephone Encounter (Signed)
-----   Message from Sueanne Margarita, MD sent at 09/03/2018 12:11 PM EDT ----- Please have patient stop pravastatin and start Crestor 40 mg daily in addition to continuing Zetia 10 mg daily as his lipids are not at goal.  Please repeat FLP and ALT in 2 months.

## 2018-09-04 NOTE — Telephone Encounter (Signed)
Patient only took Zetia for 8 days and has not taken the medication in awhile. The patient accepted the medication change to Crestor and to stop pravastatin. Patient will call back in 1 month on 10/28 to determine if she wants to continue the medication. Patient is very hesitant of changing medications. Will place Lipid and ALT order if patient confirms to continue.    Sending to Dr. Radford Pax for recommendations.

## 2018-09-29 ENCOUNTER — Other Ambulatory Visit: Payer: Self-pay | Admitting: Cardiology

## 2018-09-30 MED ORDER — ROSUVASTATIN CALCIUM 40 MG PO TABS: 40.0000 mg | ORAL_TABLET | Freq: Every day | ORAL | 3 refills | Status: DC

## 2018-09-30 NOTE — Addendum Note (Signed)
Addended by: Sarina Ill on: 09/30/2018 02:23 PM   Modules accepted: Orders

## 2018-09-30 NOTE — Telephone Encounter (Signed)
Spoke with the patient, she has had no symptoms since starting Crestor 40 mg, she accepted to take and scheduled a fasting lab for 11/21.

## 2018-10-29 ENCOUNTER — Other Ambulatory Visit: Payer: Medicare Other

## 2018-11-17 ENCOUNTER — Other Ambulatory Visit: Payer: Medicare Other

## 2018-11-17 DIAGNOSIS — E7800 Pure hypercholesterolemia, unspecified: Secondary | ICD-10-CM

## 2018-11-17 DIAGNOSIS — I2510 Atherosclerotic heart disease of native coronary artery without angina pectoris: Secondary | ICD-10-CM

## 2018-11-18 LAB — LIPID PANEL
CHOL/HDL RATIO: 3.1 ratio (ref 0.0–4.4)
CHOLESTEROL TOTAL: 127 mg/dL (ref 100–199)
HDL: 41 mg/dL (ref >39)
LDL CALC: 53 mg/dL (ref 0–99)
Triglycerides: 163 mg/dL — ABNORMAL HIGH (ref 0–149)
VLDL CHOLESTEROL CAL: 33 mg/dL (ref 5–40)

## 2018-11-18 LAB — ALT: ALT: 19 IU/L (ref 0–32)

## 2018-12-24 DIAGNOSIS — I10 Essential (primary) hypertension: Secondary | ICD-10-CM | POA: Diagnosis not present

## 2018-12-24 DIAGNOSIS — E041 Nontoxic single thyroid nodule: Secondary | ICD-10-CM | POA: Diagnosis not present

## 2018-12-24 DIAGNOSIS — E1165 Type 2 diabetes mellitus with hyperglycemia: Secondary | ICD-10-CM | POA: Diagnosis not present

## 2018-12-24 DIAGNOSIS — R809 Proteinuria, unspecified: Secondary | ICD-10-CM | POA: Diagnosis not present

## 2018-12-24 DIAGNOSIS — E7800 Pure hypercholesterolemia, unspecified: Secondary | ICD-10-CM | POA: Diagnosis not present

## 2019-01-06 DIAGNOSIS — E119 Type 2 diabetes mellitus without complications: Secondary | ICD-10-CM | POA: Diagnosis not present

## 2019-01-26 NOTE — Telephone Encounter (Signed)
This encounter was created in error - please disregard.

## 2019-07-26 NOTE — Progress Notes (Signed)
Virtual Visit via Video Note   This visit type was conducted due to national recommendations for restrictions regarding the COVID-19 Pandemic (e.g. social distancing) in an effort to limit this patient's exposure and mitigate transmission in our community.  Due to her co-morbid illnesses, this patient is at least at moderate risk for complications without adequate follow up.  This format is felt to be most appropriate for this patient at this time.  All issues noted in this document were discussed and addressed.  A limited physical exam was performed with this format.  Please refer to the patient's chart for her consent to telehealth for [facility].   Evaluation Performed:  Follow-up visit  This visit type was conducted due to national recommendations for restrictions regarding the COVID-19 Pandemic (e.g. social distancing).  This format is felt to be most appropriate for this patient at this time.  All issues noted in this document were discussed and addressed.  No physical exam was performed (except for noted visual exam findings with Video Visits).  Please refer to the patient's chart (MyChart message for video visits and phone note for telephone visits) for the patient's consent to telehealth for [facility].  Date:  07/27/2019   ID:  Traci Berger, DOB 06/10/1951, MRN [id number]  Patient Location:  Home  Provider location:   [place]  PCP:  Hulan Fess, MD  Cardiologist:  Fransico Him, MD Electrophysiologist:  None   Chief Complaint:  CAD, HTN, HLD  History of Present Illness:    Traci Berger is a 68 y.o. female who presents via audio/video conferencing for a telehealth visit today.    Traci Berger is a 68 y.o. female with a hx of coronary artery calcifications noted on Chest CTdone for lung screening. Nuclear stress test showed no ischemia . She has a history of dyslipidemia, HTN and type 2 DM. There is no family history of CAD.  She is here today  for followup and is doing well.  She denies any chest pain or pressure, PND, orthopnea, LE edema, dizziness, palpitations or syncope. She has some mild DOE when she finishes her walk  but does not limit her when she is walking.  She is compliant with her meds and is tolerating meds with no SE.    The patient does not have symptoms concerning for COVID-19 infection (fever, chills, cough, or new shortness of breath).    Prior CV studies:   The following studies were reviewed today:  none  Past Medical History:  Diagnosis Date  . Coronary artery calcification seen on CAT scan 02/08/2016  . Diabetes mellitus without complication ([place])   . Hyperlipidemia   . Hypertension   . Interstitial cystitis   . Kidney stones   . Osteopenia    No past surgical history on file.   Current Meds  Medication Sig  . aspirin EC 81 MG tablet Take 81 mg by mouth daily.  Marland Kitchen EPINEPHrine 0.3 mg/0.3 mL IJ SOAJ injection Inject into the skin.  Marland Kitchen glimepiride (AMARYL) 2 MG tablet Take 4 mg by mouth 2 (two) times daily.   . hydrochlorothiazide (MICROZIDE) 12.5 MG capsule Take 12.5 mg by mouth daily.  . metFORMIN (GLUCOPHAGE) 1000 MG tablet Take 1,000 mg by mouth 2 (two) times daily.  . rosuvastatin (CRESTOR) 40 MG tablet Take 1 tablet (40 mg total) by mouth daily.  Marland Kitchen telmisartan (MICARDIS) 40 MG tablet Take 40 mg by mouth daily.  . timolol (TIMOPTIC) 0.5 % ophthalmic solution  INT 1 GTT IN OU BID     Allergies:   Other, Sulfa antibiotics, Sulfacetamide sodium, Nitrofurantoin macrocrystal, Ramipril, and Simvastatin   Social History   Tobacco Use  . Smoking status: Current Every Day Smoker    Packs/day: 1.00    Years: 35.00    Pack years: 35.00    Types: Cigarettes  . Smokeless tobacco: Never Used  . Tobacco comment: Counseled to quit smoking.  Substance Use Topics  . Alcohol use: No    Alcohol/week: 0.0 standard drinks  . Drug use: No     Family Hx: The patient's family history includes Arrhythmia in  her mother; Colon cancer in her father. There is no history of Allergic rhinitis, Angioedema, Asthma, Eczema, Immunodeficiency, or Urticaria.  ROS:   Please see the history of present illness.     All other systems reviewed and are negative.   Labs/Other Tests and Data Reviewed:    Recent Labs: 11/17/2018: ALT 19   Recent Lipid Panel Lab Results  Component Value Date/Time   CHOL 127 11/17/2018 02:29 PM   TRIG 163 (H) 11/17/2018 02:29 PM   HDL 41 11/17/2018 02:29 PM   CHOLHDL 3.1 11/17/2018 02:29 PM   LDLCALC 53 11/17/2018 02:29 PM    Wt Readings from Last 3 Encounters:  07/27/19 155 lb (70.3 kg)  07/20/18 164 lb 3.2 oz (74.5 kg)  05/15/17 163 lb 9.6 oz (74.2 kg)     Objective:    Vital Signs:  BP (!) 159/72   Pulse 74   Ht 5\' 2"  (1.575 m)   Wt 155 lb (70.3 kg)   BMI 28.35 kg/m     ASSESSMENT & PLAN:    1.  ASCAD - coronary artery calcifications noted on Chest CTA.  Nuclear stress test with no ischemia.  She has not had any anginal sx.  She will continue on ASA 81mg  daily and statin. Her last Chest CT in 2019 showed multivessel CAD.  I will repeat ner Lexiscan myoview to rule out silent ischemia.  I will also get a chest CT calcium score to get a better idea of her calcium load.  2  Hypertension - her BP is borderline elevated today.  When she got up this am it was 164/5mmHg but then she took it a while later and it was 149/19mmHg 2 hours after taking her BP meds.  She will continue on HCTZ 12.5mg  daily and Micardis 40mg  daily. I have encouraged her to check her Bp BID for a week and call her PCP to let him know if her BP is still running high since her PCP adjusts her Bp meds and checks her labs. I have encouraged her to avoid any added salt in her diet.    3.  Hyperlipidemia - her LDL was 53 in Dec 2019.  Continue on Crestor 40mg  daily.    COVID-19 Education: The signs and symptoms of COVID-19 were discussed with the patient and how to seek care for testing (follow  up with PCP or arrange E-visit).  The importance of social distancing was discussed today.  Patient Risk:   After full review of this patient's clinical status, I feel that they are at least moderate risk at this time.  Time:   Today, I have spent 20 minutes  on telemedicine discussing medical problems including CAD, HTN, HLD.  We also reviewed the symptoms of COVID 19 and the ways to protect against contracting the virus with telehealth technology.  I spent an additional  5 minutes reviewing patient's chart including labs.  Medication Adjustments/Labs and Tests Ordered: Current medicines are reviewed at length with the patient today.  Concerns regarding medicines are outlined above.  Tests Ordered: No orders of the defined types were placed in this encounter.  Medication Changes: No orders of the defined types were placed in this encounter.   Disposition:  Follow up in 1 year(s)  Signed, Fransico Him, MD  07/27/2019 10:14 AM    [facility]

## 2019-07-27 ENCOUNTER — Other Ambulatory Visit: Payer: Self-pay

## 2019-07-27 ENCOUNTER — Telehealth (INDEPENDENT_AMBULATORY_CARE_PROVIDER_SITE_OTHER): Payer: Medicare Other | Admitting: Cardiology

## 2019-07-27 VITALS — BP 159/72 | HR 74 | Ht 62.0 in | Wt 155.0 lb

## 2019-07-27 DIAGNOSIS — I2510 Atherosclerotic heart disease of native coronary artery without angina pectoris: Secondary | ICD-10-CM

## 2019-07-27 DIAGNOSIS — I119 Hypertensive heart disease without heart failure: Secondary | ICD-10-CM

## 2019-07-27 DIAGNOSIS — R079 Chest pain, unspecified: Secondary | ICD-10-CM

## 2019-07-27 DIAGNOSIS — E7800 Pure hypercholesterolemia, unspecified: Secondary | ICD-10-CM | POA: Diagnosis not present

## 2019-07-27 DIAGNOSIS — I10 Essential (primary) hypertension: Secondary | ICD-10-CM

## 2019-07-27 NOTE — Patient Instructions (Signed)
Called pt and discussed the following. She had no additional questions or concerns.  Medication Instructions:  Your physician recommends that you continue on your current medications as directed. Please refer to the Current Medication list given to you today.  Labwork: None ordered.  Testing/Procedures: Your physician has requested that you have a lexiscan myoview. For further information please visit [email or web address]. Please follow instruction sheet, as given.  Your physician has requested that you have cardiac calcium scoring CT. Cardiac computed tomography (CT) is a painless test that uses an x-ray machine to take clear, detailed pictures of your heart. For further information please visit [email or web address]. Please follow instruction sheet as given.   Follow-Up: Your physician recommends that you schedule a follow-up appointment in: 12 months with Dr. Radford Pax.  Any Other Special Instructions Will Be Listed Below (If Applicable).     If you need a refill on your cardiac medications before your next appointment, please call your pharmacy.

## 2019-07-28 ENCOUNTER — Telehealth (HOSPITAL_COMMUNITY): Payer: Self-pay

## 2019-07-28 NOTE — Telephone Encounter (Signed)
New message    The patient did a virtual visit with Dr. Radford Pax on  8.18.20.  Call the patient to set up myocardial perfusion per Dr. Radford Pax.  The patient is asking for the nurse to call back to explain why she having a myocardial perfusion test.

## 2019-07-28 NOTE — Telephone Encounter (Signed)
Spoke with pt and gave pt the explanation of why Myoview was being done.Pt verbalized understanding and agrees with plan./cy

## 2019-08-06 ENCOUNTER — Other Ambulatory Visit: Payer: Self-pay | Admitting: *Deleted

## 2019-08-06 DIAGNOSIS — Z122 Encounter for screening for malignant neoplasm of respiratory organs: Secondary | ICD-10-CM

## 2019-08-06 DIAGNOSIS — Z87891 Personal history of nicotine dependence: Secondary | ICD-10-CM

## 2019-08-06 DIAGNOSIS — F17210 Nicotine dependence, cigarettes, uncomplicated: Secondary | ICD-10-CM

## 2019-08-09 ENCOUNTER — Ambulatory Visit: Payer: Medicare Other | Admitting: Cardiology

## 2019-08-31 ENCOUNTER — Ambulatory Visit: Payer: Medicare Other

## 2019-09-08 ENCOUNTER — Other Ambulatory Visit: Payer: Self-pay

## 2019-09-08 ENCOUNTER — Ambulatory Visit
Admission: RE | Admit: 2019-09-08 | Discharge: 2019-09-08 | Disposition: A | Discharge status: Home / Self Care | Payer: Medicare Other | Source: Ambulatory Visit | Attending: Acute Care | Admitting: Acute Care

## 2019-09-08 DIAGNOSIS — Z87891 Personal history of nicotine dependence: Secondary | ICD-10-CM

## 2019-09-08 DIAGNOSIS — F17210 Nicotine dependence, cigarettes, uncomplicated: Secondary | ICD-10-CM

## 2019-09-08 DIAGNOSIS — Z122 Encounter for screening for malignant neoplasm of respiratory organs: Secondary | ICD-10-CM

## 2019-09-20 ENCOUNTER — Other Ambulatory Visit: Payer: Self-pay | Admitting: Cardiology

## 2019-09-20 MED ORDER — ROSUVASTATIN CALCIUM 40 MG PO TABS: 40.0000 mg | ORAL_TABLET | Freq: Every day | ORAL | 2 refills | Status: DC

## 2019-09-23 ENCOUNTER — Telehealth (HOSPITAL_COMMUNITY): Payer: Self-pay

## 2019-09-23 NOTE — Telephone Encounter (Signed)
Spoke with the patient, instructions given. She stated she would be here for her test. Asked to call back with any questions. EMTP

## 2019-09-28 ENCOUNTER — Ambulatory Visit (HOSPITAL_COMMUNITY): Payer: Medicare Other | Attending: Cardiovascular Disease

## 2019-09-28 ENCOUNTER — Telehealth: Payer: Self-pay | Admitting: Cardiology

## 2019-09-28 ENCOUNTER — Other Ambulatory Visit: Payer: Self-pay

## 2019-09-28 ENCOUNTER — Ambulatory Visit
Admission: RE | Admit: 2019-09-28 | Discharge: 2019-09-28 | Disposition: A | Discharge status: Home / Self Care | Payer: Medicare Other | Source: Ambulatory Visit | Attending: Cardiology | Admitting: Cardiology

## 2019-09-28 DIAGNOSIS — I2510 Atherosclerotic heart disease of native coronary artery without angina pectoris: Secondary | ICD-10-CM | POA: Insufficient documentation

## 2019-09-28 DIAGNOSIS — I10 Essential (primary) hypertension: Secondary | ICD-10-CM

## 2019-09-28 DIAGNOSIS — R079 Chest pain, unspecified: Secondary | ICD-10-CM

## 2019-09-28 DIAGNOSIS — E7800 Pure hypercholesterolemia, unspecified: Secondary | ICD-10-CM

## 2019-09-28 LAB — MYOCARDIAL PERFUSION IMAGING
LV dias vol: 42 mL (ref 46–106)
LV sys vol: 6 mL
Peak HR: 93 {beats}/min
Rest HR: 63 {beats}/min
SDS: 2
SRS: 0
SSS: 2
TID: 1.01

## 2019-09-28 MED ORDER — REGADENOSON 0.4 MG/5ML IV SOLN
0.4000 mg | Freq: Once | INTRAVENOUS | Status: AC
  Administered 2019-09-28: 0.4 mg via INTRAVENOUS

## 2019-09-28 MED ORDER — TECHNETIUM TC 99M TETROFOSMIN IV KIT
31.7000 | PACK | Freq: Once | INTRAVENOUS | Status: AC | PRN
  Administered 2019-09-28: 31.7 via INTRAVENOUS
  Filled 2019-09-28: qty 32

## 2019-09-28 MED ORDER — TECHNETIUM TC 99M TETROFOSMIN IV KIT
10.1000 | PACK | Freq: Once | INTRAVENOUS | Status: AC | PRN
  Administered 2019-09-28: 10.1 via INTRAVENOUS
  Filled 2019-09-28: qty 11

## 2019-09-28 NOTE — Telephone Encounter (Signed)
Patient returning call for CT results. 

## 2019-09-28 NOTE — Telephone Encounter (Signed)
See CT results for phone note

## 2019-09-29 ENCOUNTER — Other Ambulatory Visit: Payer: Self-pay | Admitting: *Deleted

## 2019-09-29 DIAGNOSIS — F17210 Nicotine dependence, cigarettes, uncomplicated: Secondary | ICD-10-CM

## 2019-09-29 DIAGNOSIS — Z122 Encounter for screening for malignant neoplasm of respiratory organs: Secondary | ICD-10-CM

## 2019-09-29 DIAGNOSIS — Z87891 Personal history of nicotine dependence: Secondary | ICD-10-CM

## 2019-10-25 ENCOUNTER — Telehealth: Payer: Self-pay | Admitting: Cardiology

## 2019-10-25 NOTE — Telephone Encounter (Signed)
New Message:    Pt would like her Stress Test  And CT results please.

## 2019-10-25 NOTE — Telephone Encounter (Signed)
The patient's most recent FLP and HgbA1c should be scanned in today at some point, as requested awhile back.  Thank you.

## 2019-10-25 NOTE — Telephone Encounter (Signed)
The patient has been notified of the result and verbalized understanding.  All questions (if any) were answered. Frederik Schmidt, RN 10/25/2019 2:41 PM    I requested Hemoglobin A1C from Dr Almetta Lovely office [phone].

## 2019-11-15 ENCOUNTER — Other Ambulatory Visit: Payer: Self-pay | Admitting: Endocrinology

## 2019-11-15 DIAGNOSIS — E041 Nontoxic single thyroid nodule: Secondary | ICD-10-CM

## 2019-12-21 ENCOUNTER — Ambulatory Visit
Admission: RE | Admit: 2019-12-21 | Discharge: 2019-12-21 | Disposition: A | Discharge status: Home / Self Care | Payer: Medicare Other | Source: Ambulatory Visit | Attending: Endocrinology | Admitting: Endocrinology

## 2019-12-21 DIAGNOSIS — E041 Nontoxic single thyroid nodule: Secondary | ICD-10-CM

## 2020-01-05 ENCOUNTER — Telehealth: Payer: Self-pay | Admitting: Allergy & Immunology

## 2020-01-05 NOTE — Telephone Encounter (Signed)
Patient returned the call.  Pt denied having any reactions to any vaccines and denied any reactions to colon preps, miralax, no dermal fillers.  Pt wanted to know which vaccine, per our algorithm it is safe to get either vaccine.  Also, expressed to patient that she has not been seen since 2018 and that she needed to f/u.  Pt states that she felt like she did not need to come back, since the zyrtec has been working on controlling her swelling.   Pt had no other questions, call ended.

## 2020-01-05 NOTE — Telephone Encounter (Signed)
Pt called to see if she should get the covid shot with history with swelling. [phone] home or [phone] cell phone.

## 2020-01-05 NOTE — Telephone Encounter (Signed)
Call to patient, both numbers, no answer.  Left message to call back and discuss.

## 2020-06-02 ENCOUNTER — Other Ambulatory Visit: Payer: Self-pay | Admitting: Cardiology

## 2020-08-31 ENCOUNTER — Other Ambulatory Visit: Payer: Self-pay | Admitting: Cardiology

## 2020-10-24 ENCOUNTER — Ambulatory Visit
Admission: RE | Admit: 2020-10-24 | Discharge: 2020-10-24 | Disposition: A | Discharge status: Home / Self Care | Payer: Medicare Other | Source: Ambulatory Visit | Attending: Acute Care | Admitting: Acute Care

## 2020-10-24 DIAGNOSIS — Z122 Encounter for screening for malignant neoplasm of respiratory organs: Secondary | ICD-10-CM

## 2020-10-24 DIAGNOSIS — F17210 Nicotine dependence, cigarettes, uncomplicated: Secondary | ICD-10-CM

## 2020-10-24 DIAGNOSIS — Z87891 Personal history of nicotine dependence: Secondary | ICD-10-CM

## 2020-10-25 NOTE — Progress Notes (Signed)
Please call patient and let them  know their  low dose Ct was read as a Lung RADS 1, negative study: no nodules or definitely benign nodules. Radiology recommendation is for a repeat LDCT in 12 months.Please let them  know we will order and schedule their  annual screening scan for 10/2021. Please let them  know there was notation of CAD on their  scan.  Please remind the patient  that this is a non-gated exam therefore degree or severity of disease  cannot be determined. Please have them  follow up with their PCP regarding potential risk factor modification, dietary therapy or pharmacologic therapy if clinically indicated. Pt.  is  currently on statin therapy. Please place order for annual  screening scan for 10/2021 and fax results to PCP. Thanks so much.

## 2020-10-27 ENCOUNTER — Other Ambulatory Visit: Payer: Self-pay | Admitting: *Deleted

## 2020-10-27 DIAGNOSIS — Z87891 Personal history of nicotine dependence: Secondary | ICD-10-CM

## 2020-10-27 DIAGNOSIS — F17210 Nicotine dependence, cigarettes, uncomplicated: Secondary | ICD-10-CM

## 2020-11-29 ENCOUNTER — Other Ambulatory Visit: Payer: Self-pay | Admitting: Cardiology

## 2020-12-06 ENCOUNTER — Ambulatory Visit: Payer: Medicare Other | Admitting: Cardiology

## 2020-12-06 ENCOUNTER — Encounter: Payer: Self-pay | Admitting: Cardiology

## 2020-12-06 ENCOUNTER — Other Ambulatory Visit: Payer: Self-pay

## 2020-12-06 VITALS — BP 128/78 | HR 74 | Ht 62.0 in | Wt 161.0 lb

## 2020-12-06 DIAGNOSIS — R002 Palpitations: Secondary | ICD-10-CM

## 2020-12-06 DIAGNOSIS — I2510 Atherosclerotic heart disease of native coronary artery without angina pectoris: Secondary | ICD-10-CM

## 2020-12-06 DIAGNOSIS — I10 Essential (primary) hypertension: Secondary | ICD-10-CM | POA: Diagnosis not present

## 2020-12-06 DIAGNOSIS — E119 Type 2 diabetes mellitus without complications: Secondary | ICD-10-CM | POA: Diagnosis not present

## 2020-12-06 DIAGNOSIS — E7800 Pure hypercholesterolemia, unspecified: Secondary | ICD-10-CM

## 2020-12-06 NOTE — Patient Instructions (Signed)

## 2020-12-06 NOTE — Progress Notes (Addendum)
Date:  12/06/2020   ID:  Gelene Mink, DOB 1951-09-10, MRN [id number]   PCP:  Catha Gosselin, MD  Cardiologist:  Armanda Magic, MD Electrophysiologist:  None   Chief Complaint:  CAD, HTN, HLD  History of Present Illness:    Traci Berger is a 69 y.o. female with a hx of coronary artery calcifications noted on Chest CTdone for lung screening. Nuclear stress test showed no ischemia and coronary Ca score was elevated at 220 . She has a history of dyslipidemia, HTN and type 2 DM.   She is here today for followup and is doing well.  She denies any chest pain or pressure, SOB, DOE, PND, orthopnea, LE edema, dizziness or syncope. She is compliant with her meds and is tolerating meds with no SE.  She has had 2 episodes in the past year noticed that her heart would be racing usually when laying in bed and lasts a minute and resolve.    Prior CV studies:   The following studies were reviewed today:  Lexiscan myoview and coronary Ca score 2020  Past Medical History:  Diagnosis Date  . Coronary artery calcification seen on CAT scan 02/08/2016  . Diabetes mellitus without complication (HCC)   . Hyperlipidemia   . Hypertension   . Interstitial cystitis   . Kidney stones   . Osteopenia    History reviewed. No pertinent surgical history.   Current Meds  Medication Sig  . aspirin EC 81 MG tablet Take 81 mg by mouth daily.  . cetirizine (ZYRTEC) 10 MG tablet Take 30 mg by mouth daily.  . Cholecalciferol (D3-1000 PO) Take 1 tablet by mouth daily.  Marland Kitchen EPINEPHrine 0.3 mg/0.3 mL IJ SOAJ injection Inject into the skin.  Marland Kitchen glimepiride (AMARYL) 4 MG tablet Take 4 mg by mouth in the morning and at bedtime. Use as directed.  . metFORMIN (GLUCOPHAGE) 1000 MG tablet Take 1,000 mg by mouth 2 (two) times daily.  . rosuvastatin (CRESTOR) 40 MG tablet Take 1 tablet (40 mg total) by mouth daily. Please keep upcoming appt in December with Dr. Mayford Knife before anymore refills. Thank you  . telmisartan  (MICARDIS) 40 MG tablet Take 40 mg by mouth daily.  . timolol (TIMOPTIC) 0.5 % ophthalmic solution INT 1 GTT IN OU BID     Allergies:   Other, Sulfa antibiotics, Sulfacetamide sodium, Nitrofurantoin macrocrystal, Ramipril, and Simvastatin   Social History   Tobacco Use  . Smoking status: Current Every Day Smoker    Packs/day: 1.00    Years: 35.00    Pack years: 35.00    Types: Cigarettes  . Smokeless tobacco: Never Used  . Tobacco comment: Counseled to quit smoking.  Substance Use Topics  . Alcohol use: No    Alcohol/week: 0.0 standard drinks  . Drug use: No     Family Hx: The patient's family history includes Arrhythmia in her mother; Colon cancer in her father. There is no history of Allergic rhinitis, Angioedema, Asthma, Eczema, Immunodeficiency, or Urticaria.  ROS:   Please see the history of present illness.     All other systems reviewed and are negative.   Labs/Other Tests and Data Reviewed:    Recent Labs: No results found for requested labs within last 8760 hours.   Recent Lipid Panel Lab Results  Component Value Date/Time   CHOL 127 11/17/2018 02:29 PM   TRIG 163 (H) 11/17/2018 02:29 PM   HDL 41 11/17/2018 02:29 PM   CHOLHDL 3.1 11/17/2018 02:29 PM  Keyes 53 11/17/2018 02:29 PM    Wt Readings from Last 3 Encounters:  12/06/20 161 lb (73 kg)  09/28/19 155 lb (70.3 kg)  09/08/19 155 lb (70.3 kg)     Objective:    Vital Signs:  BP 128/78   Pulse 74   Ht 5\' 2"  (1.575 m)   Wt 161 lb (73 kg)   SpO2 98%   BMI 29.45 kg/m    GEN: Well nourished, well developed in no acute distress HEENT: Normal NECK: No JVD; No carotid bruits LYMPHATICS: No lymphadenopathy CARDIAC:RRR, no murmurs, rubs, gallops RESPIRATORY:  Clear to auscultation without rales, wheezing or rhonchi  ABDOMEN: Soft, non-tender, non-distended MUSCULOSKELETAL:  No edema; No deformity  SKIN: Warm and dry NEUROLOGIC:  Alert and oriented x 3 PSYCHIATRIC:  Normal affect   EKG was  performed in the office today and showed NSR at 74bpm with no ST changes  ASSESSMENT & PLAN:    1.  ASCAD  - coronary artery calcifications noted on Chest CTA.   -Nuclear stress test with no ischemia.   -she denies any anginal symptoms since I saw her las -continue on ASA 81mg  daily and statin.  -Her last Chest CT in 2018/03/19 showed multivessel CAD>>Lexi myoview 09/2019 showed no ischemic and normal LVF and coronary Ca score by CT was 220  2  Hypertension  -Bp adequately controlled on exam today -continue Micardis 40mg  daily  3.  Hyperlipidemia  -LDL goal < 70 - her LDL was 39 in Aug 2021   -Continue on Crestor 40mg  daily.    4.  DM type 2 -HbA1 6.7% in Sept (it was 5.6% in 03-20-23 prior to her mom's death and stopped eating right or exercising) -continue metformin 1000mg  BID -she is going to watch her diet and exercise more  5.  Palpitations -these are very infrequent (2x/yr) -unlikely to catch anything on a heart monitor -recommended that she get the [facility] device  Followup with me in 1 year  Medication Adjustments/Labs and Tests Ordered: Current medicines are reviewed at length with the patient today.  Concerns regarding medicines are outlined above.  Tests Ordered: Orders Placed This Encounter  Procedures  . EKG 12-Lead   Medication Changes: No orders of the defined types were placed in this encounter.   Disposition:  Follow up in 1 year(s)  Signed, Fransico Him, MD  12/06/2020 4:37 PM    [facility]

## 2021-01-08 DIAGNOSIS — E782 Mixed hyperlipidemia: Secondary | ICD-10-CM | POA: Diagnosis not present

## 2021-01-08 DIAGNOSIS — N183 Chronic kidney disease, stage 3 unspecified: Secondary | ICD-10-CM | POA: Diagnosis not present

## 2021-01-08 DIAGNOSIS — I2510 Atherosclerotic heart disease of native coronary artery without angina pectoris: Secondary | ICD-10-CM | POA: Diagnosis not present

## 2021-01-08 DIAGNOSIS — H409 Unspecified glaucoma: Secondary | ICD-10-CM | POA: Diagnosis not present

## 2021-01-08 DIAGNOSIS — J441 Chronic obstructive pulmonary disease with (acute) exacerbation: Secondary | ICD-10-CM | POA: Diagnosis not present

## 2021-01-08 DIAGNOSIS — M8580 Other specified disorders of bone density and structure, unspecified site: Secondary | ICD-10-CM | POA: Diagnosis not present

## 2021-01-08 DIAGNOSIS — I10 Essential (primary) hypertension: Secondary | ICD-10-CM | POA: Diagnosis not present

## 2021-01-08 DIAGNOSIS — J449 Chronic obstructive pulmonary disease, unspecified: Secondary | ICD-10-CM | POA: Diagnosis not present

## 2021-01-08 DIAGNOSIS — E118 Type 2 diabetes mellitus with unspecified complications: Secondary | ICD-10-CM | POA: Diagnosis not present

## 2021-02-05 DIAGNOSIS — I10 Essential (primary) hypertension: Secondary | ICD-10-CM | POA: Diagnosis not present

## 2021-02-05 DIAGNOSIS — J449 Chronic obstructive pulmonary disease, unspecified: Secondary | ICD-10-CM | POA: Diagnosis not present

## 2021-02-05 DIAGNOSIS — N183 Chronic kidney disease, stage 3 unspecified: Secondary | ICD-10-CM | POA: Diagnosis not present

## 2021-02-05 DIAGNOSIS — M8580 Other specified disorders of bone density and structure, unspecified site: Secondary | ICD-10-CM | POA: Diagnosis not present

## 2021-02-05 DIAGNOSIS — J441 Chronic obstructive pulmonary disease with (acute) exacerbation: Secondary | ICD-10-CM | POA: Diagnosis not present

## 2021-02-05 DIAGNOSIS — H409 Unspecified glaucoma: Secondary | ICD-10-CM | POA: Diagnosis not present

## 2021-02-05 DIAGNOSIS — I2510 Atherosclerotic heart disease of native coronary artery without angina pectoris: Secondary | ICD-10-CM | POA: Diagnosis not present

## 2021-02-05 DIAGNOSIS — E118 Type 2 diabetes mellitus with unspecified complications: Secondary | ICD-10-CM | POA: Diagnosis not present

## 2021-02-05 DIAGNOSIS — E782 Mixed hyperlipidemia: Secondary | ICD-10-CM | POA: Diagnosis not present

## 2021-02-27 ENCOUNTER — Other Ambulatory Visit: Payer: Self-pay | Admitting: Cardiology

## 2021-03-05 DIAGNOSIS — N183 Chronic kidney disease, stage 3 unspecified: Secondary | ICD-10-CM | POA: Diagnosis not present

## 2021-03-05 DIAGNOSIS — E118 Type 2 diabetes mellitus with unspecified complications: Secondary | ICD-10-CM | POA: Diagnosis not present

## 2021-03-05 DIAGNOSIS — H409 Unspecified glaucoma: Secondary | ICD-10-CM | POA: Diagnosis not present

## 2021-03-05 DIAGNOSIS — I10 Essential (primary) hypertension: Secondary | ICD-10-CM | POA: Diagnosis not present

## 2021-03-05 DIAGNOSIS — J449 Chronic obstructive pulmonary disease, unspecified: Secondary | ICD-10-CM | POA: Diagnosis not present

## 2021-03-05 DIAGNOSIS — E782 Mixed hyperlipidemia: Secondary | ICD-10-CM | POA: Diagnosis not present

## 2021-03-05 DIAGNOSIS — J441 Chronic obstructive pulmonary disease with (acute) exacerbation: Secondary | ICD-10-CM | POA: Diagnosis not present

## 2021-03-05 DIAGNOSIS — I2510 Atherosclerotic heart disease of native coronary artery without angina pectoris: Secondary | ICD-10-CM | POA: Diagnosis not present

## 2021-03-05 DIAGNOSIS — M8580 Other specified disorders of bone density and structure, unspecified site: Secondary | ICD-10-CM | POA: Diagnosis not present

## 2021-03-21 DIAGNOSIS — R059 Cough, unspecified: Secondary | ICD-10-CM | POA: Diagnosis not present

## 2021-03-21 DIAGNOSIS — J40 Bronchitis, not specified as acute or chronic: Secondary | ICD-10-CM | POA: Diagnosis not present

## 2021-03-21 DIAGNOSIS — R062 Wheezing: Secondary | ICD-10-CM | POA: Diagnosis not present

## 2021-03-29 NOTE — Telephone Encounter (Signed)
Close encounter 

## 2021-04-06 DIAGNOSIS — I2510 Atherosclerotic heart disease of native coronary artery without angina pectoris: Secondary | ICD-10-CM | POA: Diagnosis not present

## 2021-04-06 DIAGNOSIS — I10 Essential (primary) hypertension: Secondary | ICD-10-CM | POA: Diagnosis not present

## 2021-04-06 DIAGNOSIS — E782 Mixed hyperlipidemia: Secondary | ICD-10-CM | POA: Diagnosis not present

## 2021-04-06 DIAGNOSIS — E118 Type 2 diabetes mellitus with unspecified complications: Secondary | ICD-10-CM | POA: Diagnosis not present

## 2021-04-06 DIAGNOSIS — J449 Chronic obstructive pulmonary disease, unspecified: Secondary | ICD-10-CM | POA: Diagnosis not present

## 2021-04-06 DIAGNOSIS — N183 Chronic kidney disease, stage 3 unspecified: Secondary | ICD-10-CM | POA: Diagnosis not present

## 2021-04-06 DIAGNOSIS — H409 Unspecified glaucoma: Secondary | ICD-10-CM | POA: Diagnosis not present

## 2021-04-06 DIAGNOSIS — J441 Chronic obstructive pulmonary disease with (acute) exacerbation: Secondary | ICD-10-CM | POA: Diagnosis not present

## 2021-04-06 DIAGNOSIS — M8580 Other specified disorders of bone density and structure, unspecified site: Secondary | ICD-10-CM | POA: Diagnosis not present

## 2021-04-09 DIAGNOSIS — E1165 Type 2 diabetes mellitus with hyperglycemia: Secondary | ICD-10-CM | POA: Diagnosis not present

## 2021-04-09 DIAGNOSIS — E041 Nontoxic single thyroid nodule: Secondary | ICD-10-CM | POA: Diagnosis not present

## 2021-04-16 DIAGNOSIS — N189 Chronic kidney disease, unspecified: Secondary | ICD-10-CM | POA: Diagnosis not present

## 2021-04-16 DIAGNOSIS — E041 Nontoxic single thyroid nodule: Secondary | ICD-10-CM | POA: Diagnosis not present

## 2021-04-16 DIAGNOSIS — E1165 Type 2 diabetes mellitus with hyperglycemia: Secondary | ICD-10-CM | POA: Diagnosis not present

## 2021-04-16 DIAGNOSIS — E7800 Pure hypercholesterolemia, unspecified: Secondary | ICD-10-CM | POA: Diagnosis not present

## 2021-04-16 DIAGNOSIS — I10 Essential (primary) hypertension: Secondary | ICD-10-CM | POA: Diagnosis not present

## 2021-04-16 DIAGNOSIS — R809 Proteinuria, unspecified: Secondary | ICD-10-CM | POA: Diagnosis not present

## 2021-05-08 DIAGNOSIS — I2510 Atherosclerotic heart disease of native coronary artery without angina pectoris: Secondary | ICD-10-CM | POA: Diagnosis not present

## 2021-05-08 DIAGNOSIS — I10 Essential (primary) hypertension: Secondary | ICD-10-CM | POA: Diagnosis not present

## 2021-05-08 DIAGNOSIS — E118 Type 2 diabetes mellitus with unspecified complications: Secondary | ICD-10-CM | POA: Diagnosis not present

## 2021-05-08 DIAGNOSIS — E782 Mixed hyperlipidemia: Secondary | ICD-10-CM | POA: Diagnosis not present

## 2021-05-08 DIAGNOSIS — H409 Unspecified glaucoma: Secondary | ICD-10-CM | POA: Diagnosis not present

## 2021-05-08 DIAGNOSIS — M8580 Other specified disorders of bone density and structure, unspecified site: Secondary | ICD-10-CM | POA: Diagnosis not present

## 2021-05-08 DIAGNOSIS — N183 Chronic kidney disease, stage 3 unspecified: Secondary | ICD-10-CM | POA: Diagnosis not present

## 2021-05-08 DIAGNOSIS — J449 Chronic obstructive pulmonary disease, unspecified: Secondary | ICD-10-CM | POA: Diagnosis not present

## 2021-05-15 ENCOUNTER — Other Ambulatory Visit: Payer: Self-pay | Admitting: Endocrinology

## 2021-05-15 DIAGNOSIS — E041 Nontoxic single thyroid nodule: Secondary | ICD-10-CM

## 2021-06-09 DIAGNOSIS — M5441 Lumbago with sciatica, right side: Secondary | ICD-10-CM | POA: Diagnosis not present

## 2021-06-13 ENCOUNTER — Ambulatory Visit
Admission: RE | Admit: 2021-06-13 | Discharge: 2021-06-13 | Disposition: A | Discharge status: Home / Self Care | Payer: Medicare Other | Source: Ambulatory Visit | Attending: Endocrinology | Admitting: Endocrinology

## 2021-06-13 DIAGNOSIS — E041 Nontoxic single thyroid nodule: Secondary | ICD-10-CM

## 2021-06-24 DIAGNOSIS — E782 Mixed hyperlipidemia: Secondary | ICD-10-CM | POA: Diagnosis not present

## 2021-06-24 DIAGNOSIS — N183 Chronic kidney disease, stage 3 unspecified: Secondary | ICD-10-CM | POA: Diagnosis not present

## 2021-06-24 DIAGNOSIS — I2510 Atherosclerotic heart disease of native coronary artery without angina pectoris: Secondary | ICD-10-CM | POA: Diagnosis not present

## 2021-06-24 DIAGNOSIS — J449 Chronic obstructive pulmonary disease, unspecified: Secondary | ICD-10-CM | POA: Diagnosis not present

## 2021-06-24 DIAGNOSIS — H409 Unspecified glaucoma: Secondary | ICD-10-CM | POA: Diagnosis not present

## 2021-06-24 DIAGNOSIS — M8580 Other specified disorders of bone density and structure, unspecified site: Secondary | ICD-10-CM | POA: Diagnosis not present

## 2021-06-24 DIAGNOSIS — E118 Type 2 diabetes mellitus with unspecified complications: Secondary | ICD-10-CM | POA: Diagnosis not present

## 2021-06-24 DIAGNOSIS — I10 Essential (primary) hypertension: Secondary | ICD-10-CM | POA: Diagnosis not present

## 2021-06-24 DIAGNOSIS — J441 Chronic obstructive pulmonary disease with (acute) exacerbation: Secondary | ICD-10-CM | POA: Diagnosis not present

## 2021-06-26 NOTE — Progress Notes (Signed)
[place] [address] [place] Phone: [phone] Subjective:   I Traci Berger am serving as a Education administrator for Dr. Hulan Saas.  This visit occurred during the SARS-CoV-2 public health emergency.  Safety protocols were in place, including screening questions prior to the visit, additional usage of staff PPE, and extensive cleaning of exam room while observing appropriate contact time as indicated for disinfecting solutions.   I'm seeing this patient by the request  of:  Hulan Fess, MD  CC: Low back pain  [id number]  Traci Berger is a 70 y.o. female coming in with complaint of LBP. Patient states her back has been bothering her since July the 1st. Pain radiates down the lateral and front of her leg down to her foot. Worse at night and states she is not sleeping. Has tried prednisone and cyclobenzaprine that was prescribed by her doctor. States she walks for exercise and is diabetic. Walking causes more pain. Numbness and tingling mostly in the right but is bilateral. At its worse 8/10.       Past Medical History:  Diagnosis Date   Coronary artery calcification seen on CAT scan 02/08/2016   Diabetes mellitus without complication (HCC)    Hyperlipidemia    Hypertension    Interstitial cystitis    Kidney stones    Osteopenia    No past surgical history on file. Social History   Socioeconomic History   Marital status: Married    Spouse name: Not on file   Number of children: Not on file   Years of education: Not on file   Highest education level: Not on file  Occupational History   Not on file  Tobacco Use   Smoking status: Every Day    Packs/day: 1.00    Years: 35.00    Pack years: 35.00    Types: Cigarettes   Smokeless tobacco: Never   Tobacco comments:    Counseled to quit smoking.  Substance and Sexual Activity   Alcohol use: No    Alcohol/week: 0.0 standard drinks   Drug use: No   Sexual activity: Not on  file  Other Topics Concern   Not on file  Social History Narrative   Not on file   Social Determinants of Health   Financial Resource Strain: Not on file  Food Insecurity: Not on file  Transportation Needs: Not on file  Physical Activity: Not on file  Stress: Not on file  Social Connections: Not on file   Allergies  Allergen Reactions   Other Other (See Comments)    "bladder medication" unsure of name, makes her feel bad   Sulfa Antibiotics Nausea And Vomiting   Sulfacetamide Sodium Nausea And Vomiting   Nitrofurantoin Macrocrystal Nausea Only   Ramipril Other (See Comments)   Simvastatin Other (See Comments)   Family History  Problem Relation Age of Onset   Arrhythmia Mother    Colon cancer Father    Allergic rhinitis Neg Hx    Angioedema Neg Hx    Asthma Neg Hx    Eczema Neg Hx    Immunodeficiency Neg Hx    Urticaria Neg Hx     Current Outpatient Medications (Endocrine & Metabolic):    glimepiride (AMARYL) 4 MG tablet, Take 4 mg by mouth in the morning and at bedtime. Use as directed.   metFORMIN (GLUCOPHAGE) 1000 MG tablet, Take 1,000 mg by mouth 2 (two) times daily.  Current Outpatient Medications (Cardiovascular):  EPINEPHrine 0.3 mg/0.3 mL IJ SOAJ injection, Inject into the skin.   rosuvastatin (CRESTOR) 40 MG tablet, Take 1 tablet (40 mg total) by mouth daily.   telmisartan (MICARDIS) 40 MG tablet, Take 40 mg by mouth daily.  Current Outpatient Medications (Respiratory):    cetirizine (ZYRTEC) 10 MG tablet, Take 30 mg by mouth daily.  Current Outpatient Medications (Analgesics):    aspirin EC 81 MG tablet, Take 81 mg by mouth daily.   Current Outpatient Medications (Other):    Cholecalciferol (D3-1000 PO), Take 1 tablet by mouth daily.   gabapentin (NEURONTIN) 100 MG capsule, Take 2 capsules (200 mg total) by mouth at bedtime.   timolol (TIMOPTIC) 0.5 % ophthalmic solution, INT 1 GTT IN OU BID   Reviewed prior external information including notes  and imaging from  primary care provider As well as notes that were available from care everywhere and other healthcare systems.  Past medical history, social, surgical and family history all reviewed in electronic medical record.  No pertanent information unless stated regarding to the chief complaint.   Review of Systems:  No headache, visual changes, nausea, vomiting, diarrhea, constipation, dizziness, abdominal pain, skin rash, fevers, chills, night sweats, weight loss, swollen lymph nodes, body aches, joint swelling, chest pain, shortness of breath, mood changes. POSITIVE muscle aches  Objective  Blood pressure 120/70, pulse 75, height 5\' 2"  (1.575 m), weight 159 lb (72.1 kg), SpO2 98 %.   General: No apparent distress alert and oriented x3 mood and affect normal, dressed appropriately.  HEENT: Pupils equal, extraocular movements intact  Respiratory: Patient's speak in full sentences and does not appear short of breath  Cardiovascular: No lower extremity edema, non tender, no erythema  Gait mild antalgic.  Patient's low back exam does have some core strength noted.  Patient does have loss of lordosis.  Positive straight leg raise 25 degrees of forward flexion on the right side with weakness with dorsiflexion with 3 out of 5 strength compared to the contralateral side.  Deep tendon reflex lateral of the Achilles is intact.  Dorsalis pedis pulse is full.   97110; 15 additional minutes spent for Therapeutic exercises as stated in above notes.  This included exercises focusing on stretching, strengthening, with significant focus on eccentric aspects.   Long term goals include an improvement in range of motion, strength, endurance as well as avoiding reinjury. Patient's frequency would include in 1-2 times a day, 3-5 times a week for a duration of 6-12 weeks. Low back exercises that included:  Pelvic tilt/bracing instruction to focus on control of the pelvic girdle and lower abdominal muscles   Glute strengthening exercises, focusing on proper firing of the glutes without engaging the low back muscles Proper stretching techniques for maximum relief for the hamstrings, hip flexors, low back and some rotation where tolerated  Proper technique shown and discussed handout in great detail with ATC.  All questions were discussed and answered.   Impression and Recommendations:     The above documentation has been reviewed and is accurate and complete Lyndal Pulley, DO

## 2021-06-27 ENCOUNTER — Encounter: Payer: Self-pay | Admitting: Family Medicine

## 2021-06-27 ENCOUNTER — Ambulatory Visit (INDEPENDENT_AMBULATORY_CARE_PROVIDER_SITE_OTHER): Payer: Medicare Other

## 2021-06-27 ENCOUNTER — Ambulatory Visit: Payer: Medicare Other | Admitting: Family Medicine

## 2021-06-27 ENCOUNTER — Other Ambulatory Visit: Payer: Self-pay

## 2021-06-27 VITALS — BP 120/70 | HR 75 | Ht 62.0 in | Wt 159.0 lb

## 2021-06-27 DIAGNOSIS — G8929 Other chronic pain: Secondary | ICD-10-CM

## 2021-06-27 DIAGNOSIS — M545 Low back pain, unspecified: Secondary | ICD-10-CM

## 2021-06-27 DIAGNOSIS — M5416 Radiculopathy, lumbar region: Secondary | ICD-10-CM

## 2021-06-27 MED ORDER — GABAPENTIN 100 MG PO CAPS: 200.0000 mg | ORAL_CAPSULE | Freq: Every day | ORAL | 3 refills | Status: DC

## 2021-06-27 NOTE — Patient Instructions (Addendum)
Good to see you Lumbar xray Gabapentin 200 mg  Exercises for the low back  If not better in a week send a message we will get MRI  See me again in 4 weeks

## 2021-06-27 NOTE — Assessment & Plan Note (Signed)
Patient is having right-sided radiculopathy.  Does have weakness with dorsiflexion of the foot.  Concern for potential mild dropfoot.  Patient does have signs that is consistent with L5 and S1 distribution with a positive straight leg test.  At this point we will start on a low-dose of gabapentin.  Patient has done the prednisone recently.  Discussed icing regimen and home exercises.  We will get x-rays.  Worsening pain do feel that advanced imaging would be warranted especially with the weakness.  Patient will try the conservative therapy and get back to [place] in a week and see [place] within 3 to 4 weeks otherwise.

## 2021-06-29 ENCOUNTER — Encounter: Payer: Self-pay | Admitting: Family Medicine

## 2021-07-25 NOTE — Progress Notes (Signed)
[facility] [place] [employer] Phone: [phone] Subjective:   Traci Berger, am serving as a scribe for Dr. Hulan Saas.  I'm seeing this patient by the request  of:  Hulan Fess, MD  CC: Low back pain follow-up  [id number]  06/27/2021 Patient is having right-sided radiculopathy.  Does have weakness with dorsiflexion of the foot.  Concern for potential mild dropfoot.  Patient does have signs that is consistent with L5 and S1 distribution with a positive straight leg test.  At this point we will start on a low-dose of gabapentin.  Patient has done the prednisone recently.  Discussed icing regimen and home exercises.  We will get x-rays.  Worsening pain do feel that advanced imaging would be warranted especially with the weakness.  Patient will try the conservative therapy and get back to [place] in a week and see [place] within 3 to 4 weeks otherwise.  Update 07/26/2021 Traci Berger is a 70 y.o. female coming in with complaint of lumbar spine pain. Patient states it is much better but for the last week she can still feel slight pain but has now noticed some pain in the back of the right leg that travels down to her foot. Not having that issues now since she got on the gabapentin.  Would state that she is feeling 85 to 90% better  Xray lumbar 06/27/2021 IMPRESSION: Mild multilevel degenerative changes. Levocurvature of the lumbar Spine       Past Medical History:  Diagnosis Date   Coronary artery calcification seen on CAT scan 02/08/2016   Diabetes mellitus without complication (HCC)    Hyperlipidemia    Hypertension    Interstitial cystitis    Kidney stones    Osteopenia    No past surgical history on file. Social History   Socioeconomic History   Marital status: Married    Spouse name: Not on file   Number of children: Not on file   Years of education: Not on file   Highest education level: Not on file  Occupational  History   Not on file  Tobacco Use   Smoking status: Every Day    Packs/day: 1.00    Years: 35.00    Pack years: 35.00    Types: Cigarettes   Smokeless tobacco: Never   Tobacco comments:    Counseled to quit smoking.  Substance and Sexual Activity   Alcohol use: No    Alcohol/week: 0.0 standard drinks   Drug use: No   Sexual activity: Not on file  Other Topics Concern   Not on file  Social History Narrative   Not on file   Social Determinants of Health   Financial Resource Strain: Not on file  Food Insecurity: Not on file  Transportation Needs: Not on file  Physical Activity: Not on file  Stress: Not on file  Social Connections: Not on file   Allergies  Allergen Reactions   Other Other (See Comments)    "bladder medication" unsure of name, makes her feel bad   Sulfa Antibiotics Nausea And Vomiting   Sulfacetamide Sodium Nausea And Vomiting   Nitrofurantoin Macrocrystal Nausea Only   Ramipril Other (See Comments)   Simvastatin Other (See Comments)   Family History  Problem Relation Age of Onset   Arrhythmia Mother    Colon cancer Father    Allergic rhinitis Neg Hx    Angioedema Neg Hx    Asthma Neg Hx  Eczema Neg Hx    Immunodeficiency Neg Hx    Urticaria Neg Hx     Current Outpatient Medications (Endocrine & Metabolic):    glimepiride (AMARYL) 4 MG tablet, Take 4 mg by mouth in the morning and at bedtime. Use as directed.   metFORMIN (GLUCOPHAGE) 1000 MG tablet, Take 1,000 mg by mouth 2 (two) times daily.  Current Outpatient Medications (Cardiovascular):    EPINEPHrine 0.3 mg/0.3 mL IJ SOAJ injection, Inject into the skin.   rosuvastatin (CRESTOR) 40 MG tablet, Take 1 tablet (40 mg total) by mouth daily.   telmisartan (MICARDIS) 40 MG tablet, Take 40 mg by mouth daily.  Current Outpatient Medications (Respiratory):    cetirizine (ZYRTEC) 10 MG tablet, Take 30 mg by mouth daily.  Current Outpatient Medications (Analgesics):    aspirin EC 81 MG tablet,  Take 81 mg by mouth daily.   Current Outpatient Medications (Other):    Cholecalciferol (D3-1000 PO), Take 1 tablet by mouth daily.   gabapentin (NEURONTIN) 100 MG capsule, Take 2 capsules (200 mg total) by mouth at bedtime.   timolol (TIMOPTIC) 0.5 % ophthalmic solution, INT 1 GTT IN OU BID   Reviewed prior external information including notes and imaging from  primary care provider As well as notes that were available from care everywhere and other healthcare systems.  Past medical history, social, surgical and family history all reviewed in electronic medical record.  No pertanent information unless stated regarding to the chief complaint.   Review of Systems:  No headache, visual changes, nausea, vomiting, diarrhea, constipation, dizziness, abdominal pain, skin rash, fevers, chills, night sweats, weight loss, swollen lymph nodes, body aches, joint swelling, chest pain, shortness of breath, mood changes. POSITIVE muscle aches  Objective  Blood pressure (!) 100/58, pulse 80, height '5\' 2"'$  (1.575 m), weight 160 lb (72.6 kg), SpO2 97 %.   General: No apparent distress alert and oriented x3 mood and affect normal, dressed appropriately.  HEENT: Pupils equal, extraocular movements intact  Respiratory: Patient's speak in full sentences and does not appear short of breath  Cardiovascular: No lower extremity edema, non tender, no erythema  Gait normal with good balance and coordination.  MSK:  Non tender with full range of motion and good stability and symmetric strength and tone of shoulders, elbows, wrist, hip, knee and ankles bilaterally.  Patient back exam shows very mild loss of lordosis.  Still has 4 out of 5 strength with dorsiflexion of the right foot.  Negative straight leg test no noted.  Neurovascularly intact distally.    Impression and Recommendations:     The above documentation has been reviewed and is accurate and complete Lyndal Pulley, DO

## 2021-07-26 ENCOUNTER — Ambulatory Visit: Payer: Medicare Other | Admitting: Family Medicine

## 2021-07-26 ENCOUNTER — Other Ambulatory Visit: Payer: Self-pay

## 2021-07-26 ENCOUNTER — Encounter: Payer: Self-pay | Admitting: Family Medicine

## 2021-07-26 DIAGNOSIS — M5416 Radiculopathy, lumbar region: Secondary | ICD-10-CM | POA: Diagnosis not present

## 2021-07-26 NOTE — Patient Instructions (Addendum)
Good to see you  Keep doing the exeresis Okay to start walking 2 times a week for 2 weeks then increase days slowly Continue the gabapentin See me again in 2 months

## 2021-07-26 NOTE — Assessment & Plan Note (Signed)
Patient has had radicular symptoms previously.  Does have some mild weakness noted of the right lower extremity.  Overall though significant improvement from previous exam.  Patient has made significant improvement and encouraged her to start walking again which is with patient enjoys.  Patient will follow-up with me again in 6 to 8 weeks

## 2021-08-01 ENCOUNTER — Telehealth: Payer: Self-pay | Admitting: Family Medicine

## 2021-08-01 NOTE — Telephone Encounter (Signed)
Exercises printed and mailed to patient.

## 2021-08-01 NOTE — Telephone Encounter (Signed)
Patient called stating that she has misplaced her exercises and asked if a new copy to be mailed to her?

## 2021-08-08 ENCOUNTER — Encounter: Payer: Self-pay | Admitting: Family Medicine

## 2021-08-15 ENCOUNTER — Encounter: Payer: Self-pay | Admitting: Family Medicine

## 2021-08-15 ENCOUNTER — Other Ambulatory Visit: Payer: Self-pay

## 2021-08-15 ENCOUNTER — Ambulatory Visit: Payer: Medicare Other | Admitting: Family Medicine

## 2021-08-15 VITALS — BP 138/74 | HR 84 | Ht 62.0 in | Wt 160.0 lb

## 2021-08-15 DIAGNOSIS — M5416 Radiculopathy, lumbar region: Secondary | ICD-10-CM | POA: Diagnosis not present

## 2021-08-15 MED ORDER — TRAZODONE HCL 50 MG PO TABS: 25.0000 mg | ORAL_TABLET | Freq: Every evening | ORAL | 3 refills | Status: DC | PRN

## 2021-08-15 MED ORDER — METHYLPREDNISOLONE ACETATE 40 MG/ML IJ SUSP
40.0000 mg | Freq: Once | INTRAMUSCULAR | Status: AC
  Administered 2021-08-15: 40 mg via INTRAMUSCULAR

## 2021-08-15 MED ORDER — KETOROLAC TROMETHAMINE 30 MG/ML IJ SOLN
30.0000 mg | Freq: Once | INTRAMUSCULAR | Status: AC
  Administered 2021-08-15: 30 mg via INTRAMUSCULAR

## 2021-08-15 NOTE — Patient Instructions (Signed)
Depending on findings will talk about epidural Watch blood sugars for a few days If symptoms worsen, please seek medical care at the emergency room We will be in contact once we have results Trazadone at night

## 2021-08-15 NOTE — Assessment & Plan Note (Addendum)
And once again worsening symptoms please go to the emergency patient is having some mild increasing weakness as well.  Patient's x-rays do show some degenerative disc as well as facet arthropathy.  Patient has a positive straight leg test on the right side.  Patient has had a history of kidney stones but did not see anything on the last x-rays and patient denies any dysuria.  Patient given Toradol and Depo-Medrol at low doses and patient will monitor her blood sugars but states that her blood sugars have been between 90-120.  Warned of potential side effects.  We will get MRI and could be a candidate for possible epidurals.  With those not having access to labs we did discuss that if any worsening symptoms to seek medical attention immediately.  Follow-up with me again after potential imaging we will discuss about the epidural.

## 2021-08-15 NOTE — Progress Notes (Signed)
[place] [facility] Phone: [phone] Subjective:   Traci Berger, am serving as a scribe for Dr. Hulan Saas.  This visit occurred during the SARS-CoV-2 public health emergency.  Safety protocols were in place, including screening questions prior to the visit, additional usage of staff PPE, and extensive cleaning of exam room while observing appropriate contact time as indicated for disinfecting solutions.    CC: Low back pain  [id number]  07/26/2021 Patient has had radicular symptoms previously.  Does have some mild weakness noted of the right lower extremity.  Overall though significant improvement from previous exam.  Patient has made significant improvement and encouraged her to start walking again which is with patient enjoys.  Patient will follow-up with me again in 6 to 8 weeks  Update 08/15/2021 Traci Berger is a 70 y.o. female coming in with complaint of back pain. Sent MyChart message about increase in pain and was to take '300mg'$  of gabapentin. Increased medication and this did not help. Patient states that she is having pain in R glute that radiates down the R leg to her heel. Pain is worse since last visit.  Patient states that it is affecting daily activities and is unable to sleep secondary to the pain.  Feels like her leg is getting heavier.     Past Medical History:  Diagnosis Date   Coronary artery calcification seen on CAT scan 02/08/2016   Diabetes mellitus without complication (HCC)    Hyperlipidemia    Hypertension    Interstitial cystitis    Kidney stones    Osteopenia    Berger past surgical history on file. Social History   Socioeconomic History   Marital status: Married    Spouse name: Not on file   Number of children: Not on file   Years of education: Not on file   Highest education level: Not on file  Occupational History   Not on file  Tobacco Use   Smoking status: Every Day     Packs/day: 1.00    Years: 35.00    Pack years: 35.00    Types: Cigarettes   Smokeless tobacco: Never   Tobacco comments:    Counseled to quit smoking.  Substance and Sexual Activity   Alcohol use: Berger    Alcohol/week: 0.0 standard drinks   Drug use: Berger   Sexual activity: Not on file  Other Topics Concern   Not on file  Social History Narrative   Not on file   Social Determinants of Health   Financial Resource Strain: Not on file  Food Insecurity: Not on file  Transportation Needs: Not on file  Physical Activity: Not on file  Stress: Not on file  Social Connections: Not on file   Allergies  Allergen Reactions   Other Other (See Comments)    "bladder medication" unsure of name, makes her feel bad   Sulfa Antibiotics Nausea And Vomiting   Sulfacetamide Sodium Nausea And Vomiting   Nitrofurantoin Macrocrystal Nausea Only   Ramipril Other (See Comments)   Simvastatin Other (See Comments)   Family History  Problem Relation Age of Onset   Arrhythmia Mother    Colon cancer Father    Allergic rhinitis Neg Hx    Angioedema Neg Hx    Asthma Neg Hx    Eczema Neg Hx    Immunodeficiency Neg Hx    Urticaria Neg Hx     Current Outpatient Medications (Endocrine & Metabolic):  glimepiride (AMARYL) 4 MG tablet, Take 4 mg by mouth in the morning and at bedtime. Use as directed.   metFORMIN (GLUCOPHAGE) 1000 MG tablet, Take 1,000 mg by mouth 2 (two) times daily.  Current Outpatient Medications (Cardiovascular):    EPINEPHrine 0.3 mg/0.3 mL IJ SOAJ injection, Inject into the skin.   rosuvastatin (CRESTOR) 40 MG tablet, Take 1 tablet (40 mg total) by mouth daily.   telmisartan (MICARDIS) 40 MG tablet, Take 40 mg by mouth daily.  Current Outpatient Medications (Respiratory):    cetirizine (ZYRTEC) 10 MG tablet, Take 30 mg by mouth daily.  Current Outpatient Medications (Analgesics):    aspirin EC 81 MG tablet, Take 81 mg by mouth daily.   Current Outpatient Medications  (Other):    Cholecalciferol (D3-1000 PO), Take 1 tablet by mouth daily.   gabapentin (NEURONTIN) 100 MG capsule, Take 2 capsules (200 mg total) by mouth at bedtime.   timolol (TIMOPTIC) 0.5 % ophthalmic solution, INT 1 GTT IN OU BID   traZODone (DESYREL) 50 MG tablet, Take 0.5-1 tablets (25-50 mg total) by mouth at bedtime as needed for sleep.   Reviewed prior external information including notes and imaging from  primary care provider As well as notes that were available from care everywhere and other healthcare systems.  Past medical history, social, surgical and family history all reviewed in electronic medical record.  Berger pertanent information unless stated regarding to the chief complaint.   Review of Systems:  Berger headache, visual changes, nausea, vomiting, diarrhea, constipation, dizziness, abdominal pain, skin rash, fevers, chills, night sweats, weight loss, swollen lymph nodes,  joint swelling, chest pain, shortness of breath, mood changes. POSITIVE muscle aches, body aches as well as leg weakness  Objective  Blood pressure 138/74, pulse 84, height '5\' 2"'$  (1.575 m), weight 160 lb (72.6 kg), SpO2 98 %.   General: Berger apparent distress alert and oriented x3 mood and affect normal, dressed appropriately.  HEENT: Pupils equal, extraocular movements intact  Respiratory: Patient's speak in full sentences and does not appear short of breath  Cardiovascular: Berger lower extremity edema, non tender, Berger erythema  Antalgic gait noted. Foot exam does show the patient does have weakening of the dorsi flexion.  Seems to be worse than previous exam.  Patient does have a positive straight leg test noted on the right side today.  Patient is highly uncomfortable sitting in the chair.  Back exam diffuse tenderness mostly in the right paraspinal musculature.  Berger CVA tenderness noted.  Worsening pain with any type of flexion or extension of the back    Impression and Recommendations:     The above  documentation has been reviewed and is accurate and complete Lyndal Pulley, DO

## 2021-08-16 DIAGNOSIS — N183 Chronic kidney disease, stage 3 unspecified: Secondary | ICD-10-CM | POA: Diagnosis not present

## 2021-08-16 DIAGNOSIS — M8580 Other specified disorders of bone density and structure, unspecified site: Secondary | ICD-10-CM | POA: Diagnosis not present

## 2021-08-16 DIAGNOSIS — E118 Type 2 diabetes mellitus with unspecified complications: Secondary | ICD-10-CM | POA: Diagnosis not present

## 2021-08-16 DIAGNOSIS — J449 Chronic obstructive pulmonary disease, unspecified: Secondary | ICD-10-CM | POA: Diagnosis not present

## 2021-08-16 DIAGNOSIS — I10 Essential (primary) hypertension: Secondary | ICD-10-CM | POA: Diagnosis not present

## 2021-08-16 DIAGNOSIS — H409 Unspecified glaucoma: Secondary | ICD-10-CM | POA: Diagnosis not present

## 2021-08-16 DIAGNOSIS — E782 Mixed hyperlipidemia: Secondary | ICD-10-CM | POA: Diagnosis not present

## 2021-08-16 DIAGNOSIS — I2510 Atherosclerotic heart disease of native coronary artery without angina pectoris: Secondary | ICD-10-CM | POA: Diagnosis not present

## 2021-08-16 DIAGNOSIS — J441 Chronic obstructive pulmonary disease with (acute) exacerbation: Secondary | ICD-10-CM | POA: Diagnosis not present

## 2021-08-17 ENCOUNTER — Ambulatory Visit: Payer: Medicare Other | Admitting: Family Medicine

## 2021-08-19 ENCOUNTER — Other Ambulatory Visit: Payer: Self-pay

## 2021-08-19 ENCOUNTER — Ambulatory Visit (INDEPENDENT_AMBULATORY_CARE_PROVIDER_SITE_OTHER): Payer: Medicare Other

## 2021-08-19 DIAGNOSIS — M5117 Intervertebral disc disorders with radiculopathy, lumbosacral region: Secondary | ICD-10-CM | POA: Diagnosis not present

## 2021-08-19 DIAGNOSIS — M4726 Other spondylosis with radiculopathy, lumbar region: Secondary | ICD-10-CM | POA: Diagnosis not present

## 2021-08-19 DIAGNOSIS — M5416 Radiculopathy, lumbar region: Secondary | ICD-10-CM

## 2021-08-19 DIAGNOSIS — M79661 Pain in right lower leg: Secondary | ICD-10-CM | POA: Diagnosis not present

## 2021-08-20 ENCOUNTER — Telehealth: Payer: Self-pay | Admitting: Family Medicine

## 2021-08-20 ENCOUNTER — Encounter: Payer: Self-pay | Admitting: Family Medicine

## 2021-08-20 ENCOUNTER — Other Ambulatory Visit: Payer: Self-pay

## 2021-08-20 DIAGNOSIS — M5416 Radiculopathy, lumbar region: Secondary | ICD-10-CM

## 2021-08-20 NOTE — Telephone Encounter (Signed)
Order placed. Patient notified.  

## 2021-08-20 NOTE — Telephone Encounter (Signed)
Patient called. She would like to proceed with the epidural. She asked if this could go ahead and be ordered for her.

## 2021-08-21 ENCOUNTER — Other Ambulatory Visit: Payer: Self-pay | Admitting: Neurosurgery

## 2021-08-25 DIAGNOSIS — N3001 Acute cystitis with hematuria: Secondary | ICD-10-CM | POA: Diagnosis not present

## 2021-08-27 ENCOUNTER — Other Ambulatory Visit: Payer: Self-pay

## 2021-08-27 ENCOUNTER — Ambulatory Visit
Admission: RE | Admit: 2021-08-27 | Discharge: 2021-08-27 | Disposition: A | Discharge status: Home / Self Care | Payer: Medicare Other | Source: Ambulatory Visit | Attending: Family Medicine | Admitting: Family Medicine

## 2021-08-27 DIAGNOSIS — M5116 Intervertebral disc disorders with radiculopathy, lumbar region: Secondary | ICD-10-CM | POA: Diagnosis not present

## 2021-08-27 DIAGNOSIS — M5416 Radiculopathy, lumbar region: Secondary | ICD-10-CM

## 2021-08-27 MED ORDER — IOPAMIDOL (ISOVUE-M 200) INJECTION 41%
1.0000 mL | Freq: Once | INTRAMUSCULAR | Status: AC
  Administered 2021-08-27: 1 mL via EPIDURAL

## 2021-08-27 MED ORDER — METHYLPREDNISOLONE ACETATE 40 MG/ML INJ SUSP (RADIOLOG
80.0000 mg | Freq: Once | INTRAMUSCULAR | Status: AC
  Administered 2021-08-27: 80 mg via EPIDURAL

## 2021-08-27 NOTE — Discharge Instructions (Signed)

## 2021-09-26 NOTE — Progress Notes (Deleted)
[place] Phone: [phone] Subjective:    I'm seeing this patient by the request  of:  Hulan Fess, MD  CC:   [id number]  08/15/2021 And once again worsening symptoms please go to the emergency patient is having some mild increasing weakness as well.  Patient's x-rays do show some degenerative disc as well as facet arthropathy.  Patient has a positive straight leg test on the right side.  Patient has had a history of kidney stones but did not see anything on the last x-rays and patient denies any dysuria.  Patient given Toradol and Depo-Medrol at low doses and patient will monitor her blood sugars but states that her blood sugars have been between 90-120.  Warned of potential side effects.  We will get MRI and could be a candidate for possible epidurals.  With those not having access to labs we did discuss that if any worsening symptoms to seek medical attention immediately.  Follow-up with me again after potential imaging we will discuss about the epidural.  Updated 09/27/2021 Traci Berger is a 69 y.o. female coming in with complaint of LBP. Epi on 08/27/2021  MRI IMPRESSION: Mild degenerative changes, with moderate right and mild left neural foraminal narrowing at L4-L5.  Onset-  Location Duration-  Character- Aggravating factors- Reliving factors-  Therapies tried-  Severity-     Past Medical History:  Diagnosis Date   Coronary artery calcification seen on CAT scan 02/08/2016   Diabetes mellitus without complication (HCC)    Hyperlipidemia    Hypertension    Interstitial cystitis    Kidney stones    Osteopenia    No past surgical history on file. Social History   Socioeconomic History   Marital status: Married    Spouse name: Not on file   Number of children: Not on file   Years of education: Not on file   Highest education level: Not on file  Occupational History   Not on file   Tobacco Use   Smoking status: Every Day    Packs/day: 1.00    Years: 35.00    Pack years: 35.00    Types: Cigarettes   Smokeless tobacco: Never   Tobacco comments:    Counseled to quit smoking.  Substance and Sexual Activity   Alcohol use: No    Alcohol/week: 0.0 standard drinks   Drug use: No   Sexual activity: Not on file  Other Topics Concern   Not on file  Social History Narrative   Not on file   Social Determinants of Health   Financial Resource Strain: Not on file  Food Insecurity: Not on file  Transportation Needs: Not on file  Physical Activity: Not on file  Stress: Not on file  Social Connections: Not on file   Allergies  Allergen Reactions   Other Other (See Comments)    "bladder medication" unsure of name, makes her feel bad   Sulfa Antibiotics Nausea And Vomiting   Sulfacetamide Sodium Nausea And Vomiting   Nitrofurantoin Macrocrystal Nausea Only   Ramipril Other (See Comments)   Simvastatin Other (See Comments)   Family History  Problem Relation Age of Onset   Arrhythmia Mother    Colon cancer Father    Allergic rhinitis Neg Hx    Angioedema Neg Hx    Asthma Neg Hx    Eczema Neg Hx    Immunodeficiency Neg Hx    Urticaria Neg Hx  Current Outpatient Medications (Endocrine & Metabolic):    glimepiride (AMARYL) 4 MG tablet, Take 4 mg by mouth in the morning and at bedtime. Use as directed.   metFORMIN (GLUCOPHAGE) 1000 MG tablet, Take 1,000 mg by mouth 2 (two) times daily.  Current Outpatient Medications (Cardiovascular):    EPINEPHrine 0.3 mg/0.3 mL IJ SOAJ injection, Inject into the skin.   rosuvastatin (CRESTOR) 40 MG tablet, Take 1 tablet (40 mg total) by mouth daily.   telmisartan (MICARDIS) 40 MG tablet, Take 40 mg by mouth daily.  Current Outpatient Medications (Respiratory):    cetirizine (ZYRTEC) 10 MG tablet, Take 30 mg by mouth daily.  Current Outpatient Medications (Analgesics):    aspirin EC 81 MG tablet, Take 81 mg by mouth  daily.   Current Outpatient Medications (Other):    Cholecalciferol (D3-1000 PO), Take 1 tablet by mouth daily.   gabapentin (NEURONTIN) 100 MG capsule, Take 2 capsules (200 mg total) by mouth at bedtime.   timolol (TIMOPTIC) 0.5 % ophthalmic solution, INT 1 GTT IN OU BID   traZODone (DESYREL) 50 MG tablet, Take 0.5-1 tablets (25-50 mg total) by mouth at bedtime as needed for sleep.   Reviewed prior external information including notes and imaging from  primary care provider As well as notes that were available from care everywhere and other healthcare systems.  Past medical history, social, surgical and family history all reviewed in electronic medical record.  No pertanent information unless stated regarding to the chief complaint.   Review of Systems:  No headache, visual changes, nausea, vomiting, diarrhea, constipation, dizziness, abdominal pain, skin rash, fevers, chills, night sweats, weight loss, swollen lymph nodes, body aches, joint swelling, chest pain, shortness of breath, mood changes. POSITIVE muscle aches  Objective  There were no vitals taken for this visit.   General: No apparent distress alert and oriented x3 mood and affect normal, dressed appropriately.  HEENT: Pupils equal, extraocular movements intact  Respiratory: Patient's speak in full sentences and does not appear short of breath  Cardiovascular: No lower extremity edema, non tender, no erythema  Gait normal with good balance and coordination.  MSK:  Non tender with full range of motion and good stability and symmetric strength and tone of shoulders, elbows, wrist, hip, knee and ankles bilaterally.     Impression and Recommendations:     The above documentation has been reviewed and is accurate and complete Belva Agee

## 2021-09-27 ENCOUNTER — Ambulatory Visit: Payer: Medicare Other | Admitting: Family Medicine

## 2021-10-01 DIAGNOSIS — D225 Melanocytic nevi of trunk: Secondary | ICD-10-CM | POA: Diagnosis not present

## 2021-10-01 DIAGNOSIS — D2272 Melanocytic nevi of left lower limb, including hip: Secondary | ICD-10-CM | POA: Diagnosis not present

## 2021-10-01 DIAGNOSIS — D2362 Other benign neoplasm of skin of left upper limb, including shoulder: Secondary | ICD-10-CM | POA: Diagnosis not present

## 2021-10-01 DIAGNOSIS — L814 Other melanin hyperpigmentation: Secondary | ICD-10-CM | POA: Diagnosis not present

## 2021-10-01 DIAGNOSIS — D485 Neoplasm of uncertain behavior of skin: Secondary | ICD-10-CM | POA: Diagnosis not present

## 2021-10-01 DIAGNOSIS — D2271 Melanocytic nevi of right lower limb, including hip: Secondary | ICD-10-CM | POA: Diagnosis not present

## 2021-10-01 DIAGNOSIS — D2262 Melanocytic nevi of left upper limb, including shoulder: Secondary | ICD-10-CM | POA: Diagnosis not present

## 2021-10-01 DIAGNOSIS — D2261 Melanocytic nevi of right upper limb, including shoulder: Secondary | ICD-10-CM | POA: Diagnosis not present

## 2021-10-01 DIAGNOSIS — D1801 Hemangioma of skin and subcutaneous tissue: Secondary | ICD-10-CM | POA: Diagnosis not present

## 2021-10-01 DIAGNOSIS — L720 Epidermal cyst: Secondary | ICD-10-CM | POA: Diagnosis not present

## 2021-10-01 DIAGNOSIS — L821 Other seborrheic keratosis: Secondary | ICD-10-CM | POA: Diagnosis not present

## 2021-10-03 DIAGNOSIS — H409 Unspecified glaucoma: Secondary | ICD-10-CM | POA: Diagnosis not present

## 2021-10-03 DIAGNOSIS — J441 Chronic obstructive pulmonary disease with (acute) exacerbation: Secondary | ICD-10-CM | POA: Diagnosis not present

## 2021-10-03 DIAGNOSIS — M8580 Other specified disorders of bone density and structure, unspecified site: Secondary | ICD-10-CM | POA: Diagnosis not present

## 2021-10-03 DIAGNOSIS — I10 Essential (primary) hypertension: Secondary | ICD-10-CM | POA: Diagnosis not present

## 2021-10-03 DIAGNOSIS — E118 Type 2 diabetes mellitus with unspecified complications: Secondary | ICD-10-CM | POA: Diagnosis not present

## 2021-10-03 DIAGNOSIS — E782 Mixed hyperlipidemia: Secondary | ICD-10-CM | POA: Diagnosis not present

## 2021-10-03 DIAGNOSIS — I2510 Atherosclerotic heart disease of native coronary artery without angina pectoris: Secondary | ICD-10-CM | POA: Diagnosis not present

## 2021-10-03 DIAGNOSIS — N183 Chronic kidney disease, stage 3 unspecified: Secondary | ICD-10-CM | POA: Diagnosis not present

## 2021-10-03 DIAGNOSIS — J449 Chronic obstructive pulmonary disease, unspecified: Secondary | ICD-10-CM | POA: Diagnosis not present

## 2021-10-04 DIAGNOSIS — Z1231 Encounter for screening mammogram for malignant neoplasm of breast: Secondary | ICD-10-CM | POA: Diagnosis not present

## 2021-10-11 ENCOUNTER — Ambulatory Visit: Payer: Medicare Other | Admitting: Family Medicine

## 2021-10-16 NOTE — Progress Notes (Signed)
[place] [address] [place] Phone: [phone] Subjective:   IVilma Meckel, am serving as a scribe for Dr. Hulan Saas. This visit occurred during the SARS-CoV-2 public health emergency.  Safety protocols were in place, including screening questions prior to the visit, additional usage of staff PPE, and extensive cleaning of exam room while observing appropriate contact time as indicated for disinfecting solutions.   I'm seeing this patient by the request  of:  Little, Lennette Bihari, MD  CC: back pain follow up   [id number]  08/15/2021 And once again worsening symptoms please go to the emergency patient is having some mild increasing weakness as well.  Patient's x-rays do show some degenerative disc as well as facet arthropathy.  Patient has a positive straight leg test on the right side.  Patient has had a history of kidney stones but did not see anything on the last x-rays and patient denies any dysuria.  Patient given Toradol and Depo-Medrol at low doses and patient will monitor her blood sugars but states that her blood sugars have been between 90-120.  Warned of potential side effects.  We will get MRI and could be a candidate for possible epidurals.  With those not having access to labs we did discuss that if any worsening symptoms to seek medical attention immediately.  Follow-up with me again after potential imaging we will discuss about the epidural.  Update 10/17/2021 SHAUNAE SIELOFF is a 70 y.o. female coming in with complaint of low back pain. Epidural 08/27/2021 after MRI showed moderate facet L4/L5. Patient states she has no pain. She can feel it , but no pain. Wants to discuss prescribed medications. No new complaints.       Past Medical History:  Diagnosis Date   Coronary artery calcification seen on CAT scan 02/08/2016   Diabetes mellitus without complication (HCC)    Hyperlipidemia    Hypertension    Interstitial  cystitis    Kidney stones    Osteopenia    No past surgical history on file. Social History   Socioeconomic History   Marital status: Married    Spouse name: Not on file   Number of children: Not on file   Years of education: Not on file   Highest education level: Not on file  Occupational History   Not on file  Tobacco Use   Smoking status: Every Day    Packs/day: 1.00    Years: 35.00    Pack years: 35.00    Types: Cigarettes   Smokeless tobacco: Never   Tobacco comments:    Counseled to quit smoking.  Substance and Sexual Activity   Alcohol use: No    Alcohol/week: 0.0 standard drinks   Drug use: No   Sexual activity: Not on file  Other Topics Concern   Not on file  Social History Narrative   Not on file   Social Determinants of Health   Financial Resource Strain: Not on file  Food Insecurity: Not on file  Transportation Needs: Not on file  Physical Activity: Not on file  Stress: Not on file  Social Connections: Not on file   Allergies  Allergen Reactions   Other Other (See Comments)    "bladder medication" unsure of name, makes her feel bad   Sulfa Antibiotics Nausea And Vomiting   Sulfacetamide Sodium Nausea And Vomiting   Nitrofurantoin Macrocrystal Nausea Only   Ramipril Other (See Comments)   Simvastatin Other (See Comments)  Family History  Problem Relation Age of Onset   Arrhythmia Mother    Colon cancer Father    Allergic rhinitis Neg Hx    Angioedema Neg Hx    Asthma Neg Hx    Eczema Neg Hx    Immunodeficiency Neg Hx    Urticaria Neg Hx     Current Outpatient Medications (Endocrine & Metabolic):    glimepiride (AMARYL) 4 MG tablet, Take 4 mg by mouth in the morning and at bedtime. Use as directed.   metFORMIN (GLUCOPHAGE) 1000 MG tablet, Take 1,000 mg by mouth 2 (two) times daily.  Current Outpatient Medications (Cardiovascular):    EPINEPHrine 0.3 mg/0.3 mL IJ SOAJ injection, Inject into the skin.   rosuvastatin (CRESTOR) 40 MG  tablet, Take 1 tablet (40 mg total) by mouth daily.   telmisartan (MICARDIS) 40 MG tablet, Take 40 mg by mouth daily.  Current Outpatient Medications (Respiratory):    cetirizine (ZYRTEC) 10 MG tablet, Take 30 mg by mouth daily.  Current Outpatient Medications (Analgesics):    aspirin EC 81 MG tablet, Take 81 mg by mouth daily.   Current Outpatient Medications (Other):    Cholecalciferol (D3-1000 PO), Take 1 tablet by mouth daily.   gabapentin (NEURONTIN) 100 MG capsule, Take 2 capsules (200 mg total) by mouth at bedtime.   timolol (TIMOPTIC) 0.5 % ophthalmic solution, INT 1 GTT IN OU BID   traZODone (DESYREL) 50 MG tablet, Take 0.5-1 tablets (25-50 mg total) by mouth at bedtime as needed for sleep.    Objective  Blood pressure (!) 122/58, height 5\' 2"  (1.575 m), weight 159 lb (72.1 kg).   General: No apparent distress alert and oriented x3 mood and affect normal, dressed appropriately.  HEENT: Pupils equal, extraocular movements intact  Respiratory: Patient's speak in full sentences and does not appear short of breath  Cardiovascular: No lower extremity edema, non tender, no erythema  Gait normal with good balance and coordination.  MSK: Patient's low back exam does have some very mild loss of lordosis.  Still has some room for improvement of core strength.  Patient has 4+ of dorsiflexion of the right foot compared to the contralateral side.  This is significant improvement from previous exam.   Impression and Recommendations:     The above documentation has been reviewed and is accurate and complete Lyndal Pulley, DO

## 2021-10-17 ENCOUNTER — Other Ambulatory Visit: Payer: Self-pay

## 2021-10-17 ENCOUNTER — Ambulatory Visit: Payer: Medicare Other | Admitting: Family Medicine

## 2021-10-17 DIAGNOSIS — E1165 Type 2 diabetes mellitus with hyperglycemia: Secondary | ICD-10-CM | POA: Diagnosis not present

## 2021-10-17 DIAGNOSIS — E7800 Pure hypercholesterolemia, unspecified: Secondary | ICD-10-CM | POA: Diagnosis not present

## 2021-10-17 DIAGNOSIS — M5416 Radiculopathy, lumbar region: Secondary | ICD-10-CM | POA: Diagnosis not present

## 2021-10-17 DIAGNOSIS — E041 Nontoxic single thyroid nodule: Secondary | ICD-10-CM | POA: Diagnosis not present

## 2021-10-17 DIAGNOSIS — R809 Proteinuria, unspecified: Secondary | ICD-10-CM | POA: Diagnosis not present

## 2021-10-17 NOTE — Assessment & Plan Note (Signed)
Patient no longer has any significant weakness of the lower extremity.  Is 100% better when he comes to pain but is very aware of it at the moment.  Patient is doing well with the gabapentin and can continue to use this at night if it is helping with the sleep as well as any type of discomfort.  Patient knows if any worsening symptoms we can repeat the injection if necessary.  Follow-up with me on an as-needed basis.  Encourage patient to restart the exercises with him in 6 weeks

## 2021-10-23 DIAGNOSIS — E041 Nontoxic single thyroid nodule: Secondary | ICD-10-CM | POA: Diagnosis not present

## 2021-10-23 DIAGNOSIS — E7800 Pure hypercholesterolemia, unspecified: Secondary | ICD-10-CM | POA: Diagnosis not present

## 2021-10-23 DIAGNOSIS — I10 Essential (primary) hypertension: Secondary | ICD-10-CM | POA: Diagnosis not present

## 2021-10-23 DIAGNOSIS — R809 Proteinuria, unspecified: Secondary | ICD-10-CM | POA: Diagnosis not present

## 2021-10-23 DIAGNOSIS — Z139 Encounter for screening, unspecified: Secondary | ICD-10-CM | POA: Diagnosis not present

## 2021-10-23 DIAGNOSIS — E1165 Type 2 diabetes mellitus with hyperglycemia: Secondary | ICD-10-CM | POA: Diagnosis not present

## 2021-10-23 DIAGNOSIS — N189 Chronic kidney disease, unspecified: Secondary | ICD-10-CM | POA: Diagnosis not present

## 2021-10-25 DIAGNOSIS — Z23 Encounter for immunization: Secondary | ICD-10-CM | POA: Diagnosis not present

## 2021-10-25 DIAGNOSIS — Z0000 Encounter for general adult medical examination without abnormal findings: Secondary | ICD-10-CM | POA: Diagnosis not present

## 2021-10-25 DIAGNOSIS — R319 Hematuria, unspecified: Secondary | ICD-10-CM | POA: Diagnosis not present

## 2021-10-30 ENCOUNTER — Ambulatory Visit: Payer: Medicare Other | Admitting: Family Medicine

## 2021-11-13 DIAGNOSIS — E11319 Type 2 diabetes mellitus with unspecified diabetic retinopathy without macular edema: Secondary | ICD-10-CM | POA: Diagnosis not present

## 2021-11-13 DIAGNOSIS — H401111 Primary open-angle glaucoma, right eye, mild stage: Secondary | ICD-10-CM | POA: Diagnosis not present

## 2021-11-13 DIAGNOSIS — H524 Presbyopia: Secondary | ICD-10-CM | POA: Diagnosis not present

## 2021-11-13 DIAGNOSIS — H5203 Hypermetropia, bilateral: Secondary | ICD-10-CM | POA: Diagnosis not present

## 2021-11-13 DIAGNOSIS — E119 Type 2 diabetes mellitus without complications: Secondary | ICD-10-CM | POA: Diagnosis not present

## 2021-11-13 DIAGNOSIS — H35059 Retinal neovascularization, unspecified, unspecified eye: Secondary | ICD-10-CM | POA: Diagnosis not present

## 2021-11-13 DIAGNOSIS — H52223 Regular astigmatism, bilateral: Secondary | ICD-10-CM | POA: Diagnosis not present

## 2022-01-01 ENCOUNTER — Other Ambulatory Visit: Payer: Self-pay | Admitting: Endocrinology

## 2022-01-01 ENCOUNTER — Other Ambulatory Visit: Payer: Self-pay | Admitting: *Deleted

## 2022-01-01 DIAGNOSIS — F17210 Nicotine dependence, cigarettes, uncomplicated: Secondary | ICD-10-CM

## 2022-01-01 DIAGNOSIS — Z87891 Personal history of nicotine dependence: Secondary | ICD-10-CM

## 2022-01-01 DIAGNOSIS — E7800 Pure hypercholesterolemia, unspecified: Secondary | ICD-10-CM

## 2022-01-15 ENCOUNTER — Encounter: Payer: Self-pay | Admitting: Cardiology

## 2022-01-15 ENCOUNTER — Ambulatory Visit: Payer: Medicare Other | Admitting: Cardiology

## 2022-01-15 ENCOUNTER — Other Ambulatory Visit: Payer: Self-pay

## 2022-01-15 VITALS — BP 120/60 | HR 66 | Ht 62.0 in | Wt 159.2 lb

## 2022-01-15 DIAGNOSIS — I10 Essential (primary) hypertension: Secondary | ICD-10-CM | POA: Diagnosis not present

## 2022-01-15 DIAGNOSIS — I2510 Atherosclerotic heart disease of native coronary artery without angina pectoris: Secondary | ICD-10-CM

## 2022-01-15 DIAGNOSIS — E7800 Pure hypercholesterolemia, unspecified: Secondary | ICD-10-CM | POA: Diagnosis not present

## 2022-01-15 NOTE — Progress Notes (Signed)
Date:  01/15/2022   ID:  Traci Berger, DOB 1951-11-20, MRN [id number]   PCP:  Lawerance Cruel, MD  Cardiologist:  Fransico Him, MD Electrophysiologist:  None   Chief Complaint:  CAD, HTN, HLD  History of Present Illness:    Traci Berger is a 71 y.o. female with a hx of coronary artery calcifications noted on Chest CT done for lung screening. Nuclear stress test showed no ischemia and coronary Ca score was elevated at 220 .  She has a history of dyslipidemia, HTN and type 2 DM.   She is here today for followup and is doing well.  She denies any chest pain or pressure, SOB, PND, orthopnea, LE edema, dizziness or syncope. Occasionally she will notice a skipped heart beat.   She occasionally will have some mild DOE after walking.  She is compliant with her meds and is tolerating meds with no SE.     Prior CV studies:   The following studies were reviewed today:  Lexiscan myoview and coronary Ca score in 2020  Past Medical History:  Diagnosis Date   Coronary artery calcification seen on CAT scan 02/08/2016   Diabetes mellitus without complication (HCC)    Hyperlipidemia    Hypertension    Interstitial cystitis    Kidney stones    Osteopenia    No past surgical history on file.   Current Meds  Medication Sig   aspirin EC 81 MG tablet Take 81 mg by mouth daily.   cetirizine (ZYRTEC) 10 MG tablet Take 30 mg by mouth daily.   Cholecalciferol (D3-1000 PO) Take 1 tablet by mouth daily.   EPINEPHrine 0.3 mg/0.3 mL IJ SOAJ injection Inject into the skin.   glimepiride (AMARYL) 4 MG tablet Take 4 mg by mouth in the morning and at bedtime. Use as directed.   metFORMIN (GLUCOPHAGE) 1000 MG tablet Take 1,000 mg by mouth 2 (two) times daily.   rosuvastatin (CRESTOR) 40 MG tablet Take 1 tablet (40 mg total) by mouth daily.   telmisartan (MICARDIS) 40 MG tablet Take 80 mg by mouth daily.   timolol (TIMOPTIC) 0.5 % ophthalmic solution INT 1 GTT IN OU BID     Allergies:    Other, Sulfa antibiotics, Sulfacetamide sodium, Nitrofurantoin macrocrystal, Ramipril, and Simvastatin   Social History   Tobacco Use   Smoking status: Every Day    Packs/day: 1.00    Years: 35.00    Pack years: 35.00    Types: Cigarettes   Smokeless tobacco: Never   Tobacco comments:    Counseled to quit smoking.  Substance Use Topics   Alcohol use: No    Alcohol/week: 0.0 standard drinks   Drug use: No     Family Hx: The patient's family history includes Arrhythmia in her mother; Colon cancer in her father. There is no history of Allergic rhinitis, Angioedema, Asthma, Eczema, Immunodeficiency, or Urticaria.  ROS:   Please see the history of present illness.     All other systems reviewed and are negative.   Labs/Other Tests and Data Reviewed:    Recent Labs: No results found for requested labs within last 8760 hours.   Recent Lipid Panel Lab Results  Component Value Date/Time   CHOL 127 11/17/2018 02:29 PM   TRIG 163 (H) 11/17/2018 02:29 PM   HDL 41 11/17/2018 02:29 PM   CHOLHDL 3.1 11/17/2018 02:29 PM   LDLCALC 53 11/17/2018 02:29 PM    Wt Readings from Last 3 Encounters:  01/15/22  159 lb 3.2 oz (72.2 kg)  10/17/21 159 lb (72.1 kg)  08/15/21 160 lb (72.6 kg)     Objective:    Vital Signs:  BP 120/60    Pulse 66    Ht 5\' 2"  (1.575 m)    Wt 159 lb 3.2 oz (72.2 kg)    SpO2 97%    BMI 29.12 kg/m    GEN: Well nourished, well developed in no acute distress HEENT: Normal NECK: No JVD; No carotid bruits LYMPHATICS: No lymphadenopathy CARDIAC:RRR, no murmurs, rubs, gallops RESPIRATORY:  Clear to auscultation without rales, wheezing or rhonchi  ABDOMEN: Soft, non-tender, non-distended MUSCULOSKELETAL:  No edema; No deformity  SKIN: Warm and dry NEUROLOGIC:  Alert and oriented x 3 PSYCHIATRIC:  Normal affect    EKG was performed in the office today and showed NSR with no ST changes  ASSESSMENT & PLAN:    1.  ASCAD  -She has not had any angina since I saw  her last -Her last Chest CT in 2019 showed multivessel CAD>>Lexi myoview 09/2019 showed no ischemic and normal LVF and coronary Ca score by CT was 220 -She will continue prescription drug management with aspirin 81 mg daily and statin  2  Hypertension  -Her BP is well controlled exam today -Continue prescription drug management with telmisartan 80 mg daily with as needed refills -Check BMET today  3.  Hyperlipidemia  -LDL goal < 70 -Check FLP and ALT -Continue prescription drug management with Crestor 40 mg daily with as needed refills  Followup with me in 1 year  Medication Adjustments/Labs and Tests Ordered: Current medicines are reviewed at length with the patient today.  Concerns regarding medicines are outlined above.  Tests Ordered: Orders Placed This Encounter  Procedures   EKG 12-Lead   Medication Changes: No orders of the defined types were placed in this encounter.   Disposition:  Follow up in 1 year(s)  Signed, Fransico Him, MD  01/15/2022 2:21 PM    [place]

## 2022-01-15 NOTE — Addendum Note (Signed)
Addended by: Molli Barrows on: 01/15/2022 02:33 PM   Modules accepted: Orders

## 2022-01-15 NOTE — Patient Instructions (Signed)
Medication Instructions:  Your physician recommends that you continue on your current medications as directed. Please refer to the Current Medication list given to you today.  *If you need a refill on your cardiac medications before your next appointment, please call your pharmacy*   Lab Work: [place] and [facility] on 01/22/2022 If you have labs (blood work) drawn today and your tests are completely normal, you will receive your results only by: [place] (if you have MyChart) OR A paper copy in the mail If you have any lab test that is abnormal or we need to change your treatment, we will call you to review the results.   Follow-Up: At [facility], you and your health needs are our priority.  As part of our continuing mission to provide you with exceptional heart care, we have created designated Provider Care Teams.  These Care Teams include your primary Cardiologist (physician) and Advanced Practice Providers (APPs -  Physician Assistants and Nurse Practitioners) who all work together to provide you with the care you need, when you need it.   Your next appointment:   1 year(s)  The format for your next appointment:   In Person  Provider:   Fransico Him, MD

## 2022-01-22 ENCOUNTER — Other Ambulatory Visit: Payer: Medicare Other | Admitting: *Deleted

## 2022-01-22 ENCOUNTER — Ambulatory Visit
Admission: RE | Admit: 2022-01-22 | Discharge: 2022-01-22 | Disposition: A | Discharge status: Home / Self Care | Payer: Medicare Other | Source: Ambulatory Visit

## 2022-01-22 ENCOUNTER — Other Ambulatory Visit: Payer: Self-pay

## 2022-01-22 DIAGNOSIS — Z87891 Personal history of nicotine dependence: Secondary | ICD-10-CM

## 2022-01-22 DIAGNOSIS — I10 Essential (primary) hypertension: Secondary | ICD-10-CM

## 2022-01-22 DIAGNOSIS — I2510 Atherosclerotic heart disease of native coronary artery without angina pectoris: Secondary | ICD-10-CM | POA: Diagnosis not present

## 2022-01-22 DIAGNOSIS — E7800 Pure hypercholesterolemia, unspecified: Secondary | ICD-10-CM | POA: Diagnosis not present

## 2022-01-22 DIAGNOSIS — F17210 Nicotine dependence, cigarettes, uncomplicated: Secondary | ICD-10-CM

## 2022-01-22 LAB — COMPREHENSIVE METABOLIC PANEL
ALT: 15 IU/L (ref 0–32)
AST: 19 IU/L (ref 0–40)
Albumin/Globulin Ratio: 2.2 (ref 1.2–2.2)
Albumin: 4.8 g/dL — ABNORMAL HIGH (ref 3.7–4.7)
Alkaline Phosphatase: 91 IU/L (ref 44–121)
BUN/Creatinine Ratio: 17 (ref 12–28)
BUN: 24 mg/dL (ref 8–27)
Bilirubin Total: 0.4 mg/dL (ref 0.0–1.2)
CO2: 19 mmol/L — ABNORMAL LOW (ref 20–29)
Calcium: 10.2 mg/dL (ref 8.7–10.3)
Chloride: 105 mmol/L (ref 96–106)
Creatinine, Ser: 1.43 mg/dL — ABNORMAL HIGH (ref 0.57–1.00)
Globulin, Total: 2.2 g/dL (ref 1.5–4.5)
Glucose: 88 mg/dL (ref 70–99)
Potassium: 4.2 mmol/L (ref 3.5–5.2)
Sodium: 140 mmol/L (ref 134–144)
Total Protein: 7 g/dL (ref 6.0–8.5)
eGFR: 39 mL/min/{1.73_m2} — ABNORMAL LOW (ref >59)

## 2022-01-22 LAB — LIPID PANEL
Chol/HDL Ratio: 2.6 ratio (ref 0.0–4.4)
Cholesterol, Total: 113 mg/dL (ref 100–199)
HDL: 44 mg/dL (ref >39)
LDL Chol Calc (NIH): 46 mg/dL (ref 0–99)
Triglycerides: 132 mg/dL (ref 0–149)
VLDL Cholesterol Cal: 23 mg/dL (ref 5–40)

## 2022-01-23 ENCOUNTER — Other Ambulatory Visit: Payer: Self-pay

## 2022-01-23 DIAGNOSIS — Z87891 Personal history of nicotine dependence: Secondary | ICD-10-CM

## 2022-01-23 DIAGNOSIS — F17210 Nicotine dependence, cigarettes, uncomplicated: Secondary | ICD-10-CM

## 2022-01-31 DIAGNOSIS — Z780 Asymptomatic menopausal state: Secondary | ICD-10-CM | POA: Diagnosis not present

## 2022-01-31 DIAGNOSIS — M8589 Other specified disorders of bone density and structure, multiple sites: Secondary | ICD-10-CM | POA: Diagnosis not present

## 2022-03-09 ENCOUNTER — Other Ambulatory Visit: Payer: Self-pay | Admitting: Cardiology

## 2022-04-11 DIAGNOSIS — H524 Presbyopia: Secondary | ICD-10-CM | POA: Diagnosis not present

## 2022-04-11 DIAGNOSIS — H52223 Regular astigmatism, bilateral: Secondary | ICD-10-CM | POA: Diagnosis not present

## 2022-04-11 DIAGNOSIS — H40013 Open angle with borderline findings, low risk, bilateral: Secondary | ICD-10-CM | POA: Diagnosis not present

## 2022-05-30 DIAGNOSIS — R300 Dysuria: Secondary | ICD-10-CM | POA: Diagnosis not present

## 2022-06-13 DIAGNOSIS — I10 Essential (primary) hypertension: Secondary | ICD-10-CM | POA: Diagnosis not present

## 2022-06-13 DIAGNOSIS — N183 Chronic kidney disease, stage 3 unspecified: Secondary | ICD-10-CM | POA: Diagnosis not present

## 2022-06-13 DIAGNOSIS — E782 Mixed hyperlipidemia: Secondary | ICD-10-CM | POA: Diagnosis not present

## 2022-06-13 DIAGNOSIS — J441 Chronic obstructive pulmonary disease with (acute) exacerbation: Secondary | ICD-10-CM | POA: Diagnosis not present

## 2022-06-13 DIAGNOSIS — E118 Type 2 diabetes mellitus with unspecified complications: Secondary | ICD-10-CM | POA: Diagnosis not present

## 2022-06-17 DIAGNOSIS — R319 Hematuria, unspecified: Secondary | ICD-10-CM | POA: Diagnosis not present

## 2022-06-17 DIAGNOSIS — E7800 Pure hypercholesterolemia, unspecified: Secondary | ICD-10-CM | POA: Diagnosis not present

## 2022-06-17 DIAGNOSIS — M8580 Other specified disorders of bone density and structure, unspecified site: Secondary | ICD-10-CM | POA: Diagnosis not present

## 2022-06-17 DIAGNOSIS — I10 Essential (primary) hypertension: Secondary | ICD-10-CM | POA: Diagnosis not present

## 2022-06-17 DIAGNOSIS — N200 Calculus of kidney: Secondary | ICD-10-CM | POA: Diagnosis not present

## 2022-06-17 DIAGNOSIS — Z780 Asymptomatic menopausal state: Secondary | ICD-10-CM | POA: Diagnosis not present

## 2022-06-17 DIAGNOSIS — E1165 Type 2 diabetes mellitus with hyperglycemia: Secondary | ICD-10-CM | POA: Diagnosis not present

## 2022-06-17 DIAGNOSIS — R809 Proteinuria, unspecified: Secondary | ICD-10-CM | POA: Diagnosis not present

## 2022-06-17 DIAGNOSIS — E041 Nontoxic single thyroid nodule: Secondary | ICD-10-CM | POA: Diagnosis not present

## 2022-06-17 DIAGNOSIS — N189 Chronic kidney disease, unspecified: Secondary | ICD-10-CM | POA: Diagnosis not present

## 2022-09-26 DIAGNOSIS — N3010 Interstitial cystitis (chronic) without hematuria: Secondary | ICD-10-CM | POA: Diagnosis not present

## 2022-09-26 DIAGNOSIS — N3020 Other chronic cystitis without hematuria: Secondary | ICD-10-CM | POA: Diagnosis not present

## 2022-09-26 DIAGNOSIS — R3129 Other microscopic hematuria: Secondary | ICD-10-CM | POA: Diagnosis not present

## 2022-09-26 DIAGNOSIS — N200 Calculus of kidney: Secondary | ICD-10-CM | POA: Diagnosis not present

## 2022-10-02 ENCOUNTER — Other Ambulatory Visit: Payer: Self-pay | Admitting: Endocrinology

## 2022-10-02 DIAGNOSIS — E041 Nontoxic single thyroid nodule: Secondary | ICD-10-CM

## 2022-10-09 DIAGNOSIS — D225 Melanocytic nevi of trunk: Secondary | ICD-10-CM | POA: Diagnosis not present

## 2022-10-09 DIAGNOSIS — L814 Other melanin hyperpigmentation: Secondary | ICD-10-CM | POA: Diagnosis not present

## 2022-10-09 DIAGNOSIS — L821 Other seborrheic keratosis: Secondary | ICD-10-CM | POA: Diagnosis not present

## 2022-10-09 DIAGNOSIS — I788 Other diseases of capillaries: Secondary | ICD-10-CM | POA: Diagnosis not present

## 2022-10-09 DIAGNOSIS — D2362 Other benign neoplasm of skin of left upper limb, including shoulder: Secondary | ICD-10-CM | POA: Diagnosis not present

## 2022-10-09 DIAGNOSIS — D2239 Melanocytic nevi of other parts of face: Secondary | ICD-10-CM | POA: Diagnosis not present

## 2022-10-09 DIAGNOSIS — L720 Epidermal cyst: Secondary | ICD-10-CM | POA: Diagnosis not present

## 2022-10-09 DIAGNOSIS — L570 Actinic keratosis: Secondary | ICD-10-CM | POA: Diagnosis not present

## 2022-10-09 DIAGNOSIS — D2261 Melanocytic nevi of right upper limb, including shoulder: Secondary | ICD-10-CM | POA: Diagnosis not present

## 2022-10-09 DIAGNOSIS — D2262 Melanocytic nevi of left upper limb, including shoulder: Secondary | ICD-10-CM | POA: Diagnosis not present

## 2022-10-09 DIAGNOSIS — D1801 Hemangioma of skin and subcutaneous tissue: Secondary | ICD-10-CM | POA: Diagnosis not present

## 2022-10-23 DIAGNOSIS — Z20822 Contact with and (suspected) exposure to covid-19: Secondary | ICD-10-CM | POA: Diagnosis not present

## 2022-10-26 IMAGING — DX DG LUMBAR SPINE COMPLETE 4+V
4 series · 5 of 5 positions shown · non-contrast
Comparison: None.

CLINICAL DATA: back pain for 3 weeks

EXAM:
LUMBAR SPINE - COMPLETE 4+ VIEW

[l-spine ap]
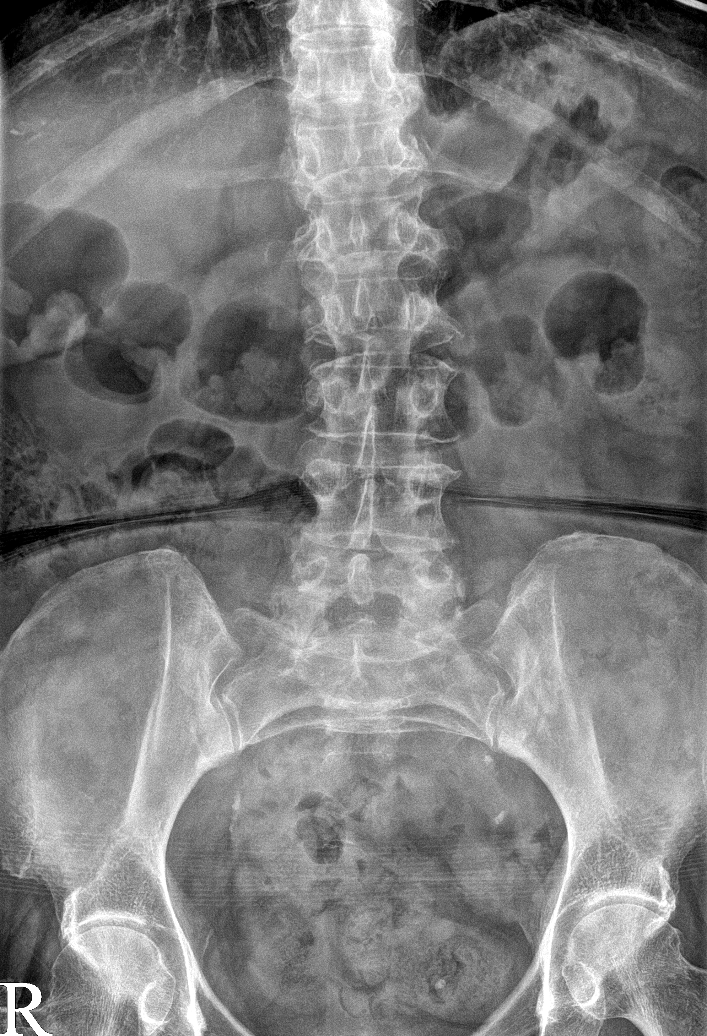

[l-spine obl (1 of 2)]
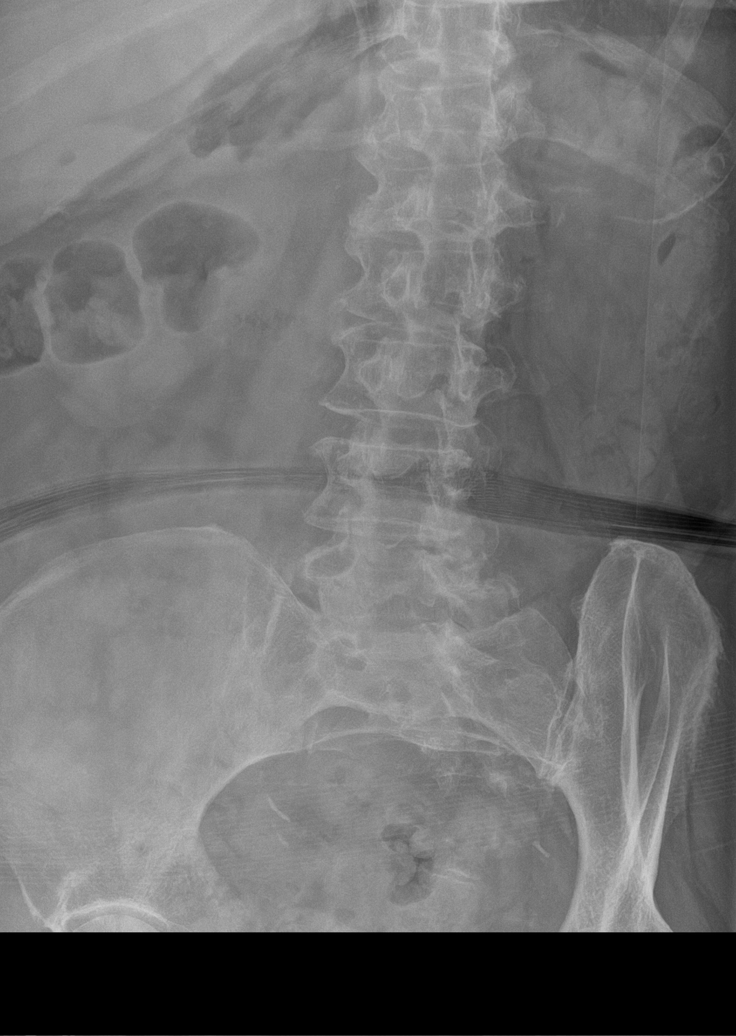

[Series 3: l-spine obl · 0.14mm/px · 2 of 2 slices shown (2 of 2)]
[im 1/2]
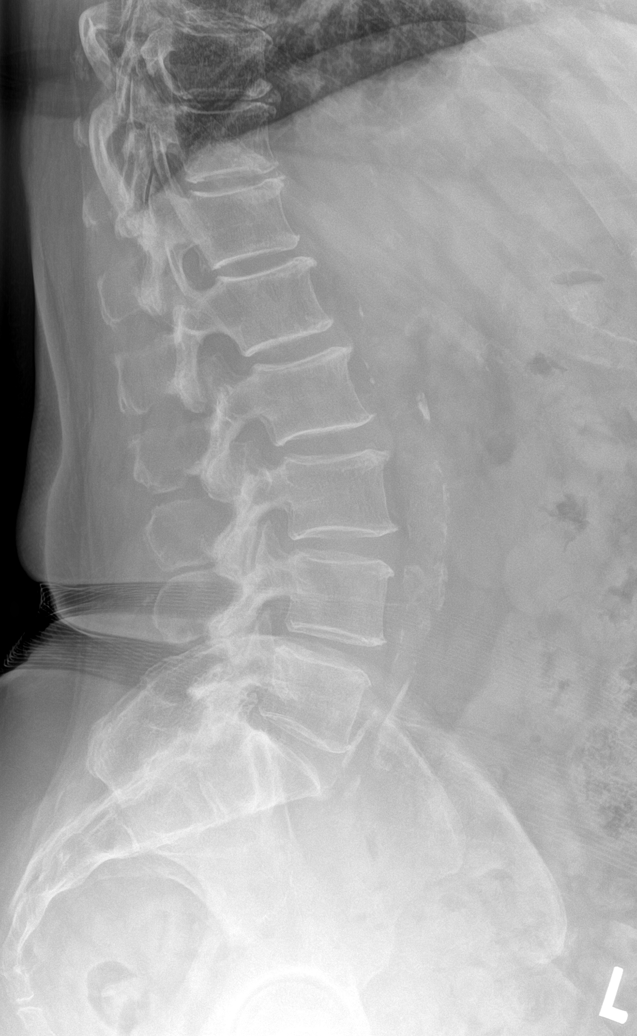
[im 2/2]
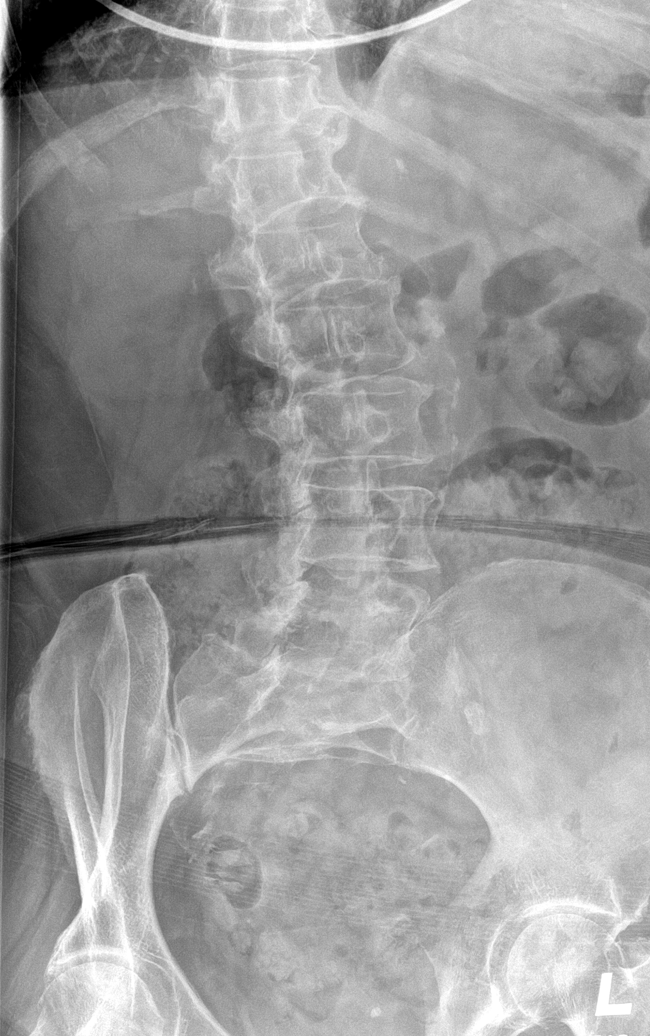

[l-spine spot]
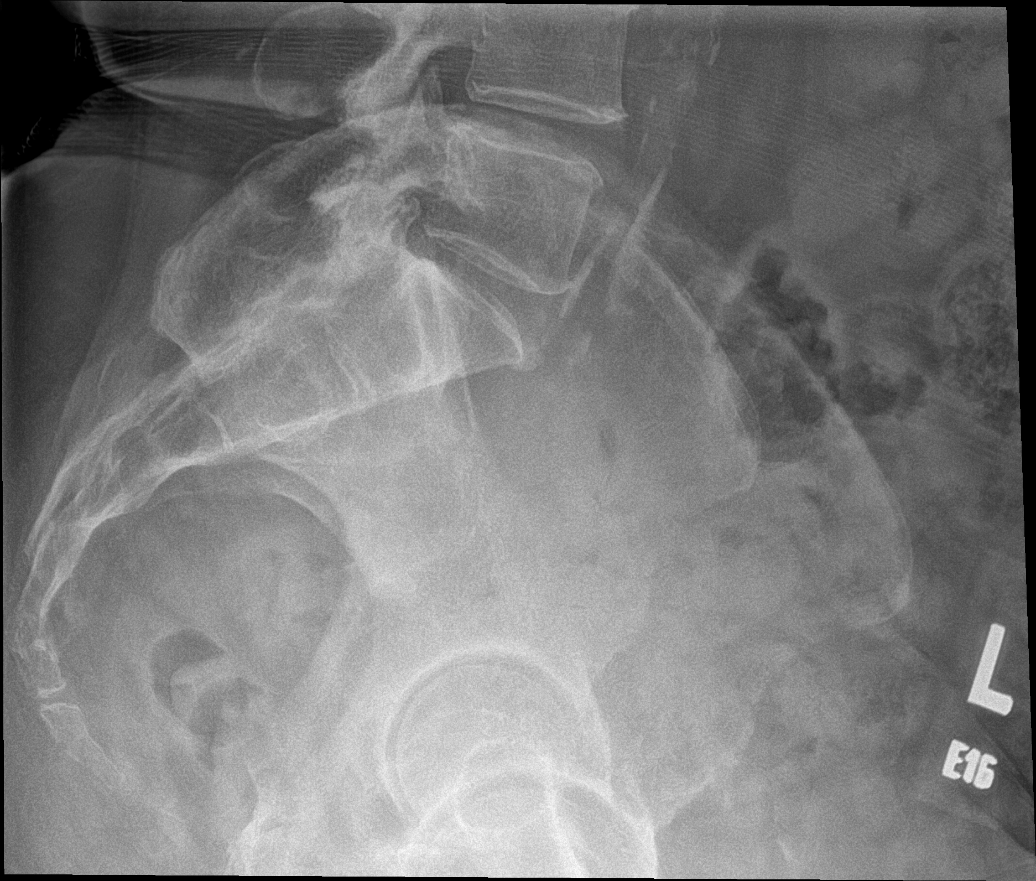

[5 of 5 positions shown; findings below may reference images not displayed]

FINDINGS: There are five non-rib bearing lumbar-type vertebral bodies. There
is levocurvature of the lumbar spine. No spondylolisthesis. There is
no evidence for acute fracture or subluxation. Minimal
intervertebral disc space height loss at L3-4. Mild multilevel
endplate proliferative changes. Lower lumbar facet arthropathy.
Vascular calcifications.
IMPRESSION: Mild multilevel degenerative changes. Levocurvature of the lumbar
spine.

Aortic Atherosclerosis ([id number]-W21.1).

## 2022-11-05 DIAGNOSIS — H524 Presbyopia: Secondary | ICD-10-CM | POA: Diagnosis not present

## 2022-11-05 DIAGNOSIS — H2700 Aphakia, unspecified eye: Secondary | ICD-10-CM | POA: Diagnosis not present

## 2022-11-05 DIAGNOSIS — Z961 Presence of intraocular lens: Secondary | ICD-10-CM | POA: Diagnosis not present

## 2022-11-05 DIAGNOSIS — H401131 Primary open-angle glaucoma, bilateral, mild stage: Secondary | ICD-10-CM | POA: Diagnosis not present

## 2022-11-06 DIAGNOSIS — Z0000 Encounter for general adult medical examination without abnormal findings: Secondary | ICD-10-CM | POA: Diagnosis not present

## 2022-11-06 DIAGNOSIS — Z23 Encounter for immunization: Secondary | ICD-10-CM | POA: Diagnosis not present

## 2022-11-06 DIAGNOSIS — H401131 Primary open-angle glaucoma, bilateral, mild stage: Secondary | ICD-10-CM | POA: Diagnosis not present

## 2022-11-06 DIAGNOSIS — I10 Essential (primary) hypertension: Secondary | ICD-10-CM | POA: Diagnosis not present

## 2022-11-06 DIAGNOSIS — N1832 Chronic kidney disease, stage 3b: Secondary | ICD-10-CM | POA: Diagnosis not present

## 2022-11-06 DIAGNOSIS — M8580 Other specified disorders of bone density and structure, unspecified site: Secondary | ICD-10-CM | POA: Diagnosis not present

## 2022-11-06 DIAGNOSIS — E1121 Type 2 diabetes mellitus with diabetic nephropathy: Secondary | ICD-10-CM | POA: Diagnosis not present

## 2022-11-06 DIAGNOSIS — I700 Atherosclerosis of aorta: Secondary | ICD-10-CM | POA: Diagnosis not present

## 2022-11-06 DIAGNOSIS — F17210 Nicotine dependence, cigarettes, uncomplicated: Secondary | ICD-10-CM | POA: Diagnosis not present

## 2022-11-06 DIAGNOSIS — J431 Panlobular emphysema: Secondary | ICD-10-CM | POA: Diagnosis not present

## 2022-11-14 DIAGNOSIS — Z1231 Encounter for screening mammogram for malignant neoplasm of breast: Secondary | ICD-10-CM | POA: Diagnosis not present

## 2022-11-26 DIAGNOSIS — Z23 Encounter for immunization: Secondary | ICD-10-CM | POA: Diagnosis not present

## 2023-01-02 DIAGNOSIS — N1832 Chronic kidney disease, stage 3b: Secondary | ICD-10-CM | POA: Diagnosis not present

## 2023-01-02 DIAGNOSIS — I10 Essential (primary) hypertension: Secondary | ICD-10-CM | POA: Diagnosis not present

## 2023-01-02 DIAGNOSIS — E1122 Type 2 diabetes mellitus with diabetic chronic kidney disease: Secondary | ICD-10-CM | POA: Diagnosis not present

## 2023-01-07 DIAGNOSIS — Z780 Asymptomatic menopausal state: Secondary | ICD-10-CM | POA: Diagnosis not present

## 2023-01-07 DIAGNOSIS — E1165 Type 2 diabetes mellitus with hyperglycemia: Secondary | ICD-10-CM | POA: Diagnosis not present

## 2023-01-07 DIAGNOSIS — E041 Nontoxic single thyroid nodule: Secondary | ICD-10-CM | POA: Diagnosis not present

## 2023-01-07 DIAGNOSIS — E7800 Pure hypercholesterolemia, unspecified: Secondary | ICD-10-CM | POA: Diagnosis not present

## 2023-01-07 DIAGNOSIS — N200 Calculus of kidney: Secondary | ICD-10-CM | POA: Diagnosis not present

## 2023-01-07 DIAGNOSIS — R809 Proteinuria, unspecified: Secondary | ICD-10-CM | POA: Diagnosis not present

## 2023-01-07 DIAGNOSIS — M8580 Other specified disorders of bone density and structure, unspecified site: Secondary | ICD-10-CM | POA: Diagnosis not present

## 2023-01-07 DIAGNOSIS — I10 Essential (primary) hypertension: Secondary | ICD-10-CM | POA: Diagnosis not present

## 2023-01-07 DIAGNOSIS — R319 Hematuria, unspecified: Secondary | ICD-10-CM | POA: Diagnosis not present

## 2023-01-07 DIAGNOSIS — N189 Chronic kidney disease, unspecified: Secondary | ICD-10-CM | POA: Diagnosis not present

## 2023-01-16 DIAGNOSIS — N1832 Chronic kidney disease, stage 3b: Secondary | ICD-10-CM | POA: Diagnosis not present

## 2023-01-21 ENCOUNTER — Encounter: Payer: Self-pay | Admitting: Cardiology

## 2023-01-21 ENCOUNTER — Ambulatory Visit: Payer: Medicare Other | Attending: Cardiology | Admitting: Cardiology

## 2023-01-21 VITALS — BP 128/62 | HR 82 | Ht 62.0 in | Wt 151.0 lb

## 2023-01-21 DIAGNOSIS — I10 Essential (primary) hypertension: Secondary | ICD-10-CM | POA: Diagnosis not present

## 2023-01-21 DIAGNOSIS — E7800 Pure hypercholesterolemia, unspecified: Secondary | ICD-10-CM | POA: Diagnosis not present

## 2023-01-21 DIAGNOSIS — I2510 Atherosclerotic heart disease of native coronary artery without angina pectoris: Secondary | ICD-10-CM | POA: Diagnosis not present

## 2023-01-21 LAB — COMPREHENSIVE METABOLIC PANEL
ALT: 19 IU/L (ref 0–32)
AST: 20 IU/L (ref 0–40)
Albumin/Globulin Ratio: 1.8 (ref 1.2–2.2)
Albumin: 4.6 g/dL (ref 3.8–4.8)
Alkaline Phosphatase: 68 IU/L (ref 44–121)
BUN/Creatinine Ratio: 14 (ref 12–28)
BUN: 25 mg/dL (ref 8–27)
Bilirubin Total: 0.4 mg/dL (ref 0.0–1.2)
CO2: 17 mmol/L — ABNORMAL LOW (ref 20–29)
Calcium: 10.1 mg/dL (ref 8.7–10.3)
Chloride: 105 mmol/L (ref 96–106)
Creatinine, Ser: 1.74 mg/dL — ABNORMAL HIGH (ref 0.57–1.00)
Globulin, Total: 2.5 g/dL (ref 1.5–4.5)
Glucose: 96 mg/dL (ref 70–99)
Potassium: 4.5 mmol/L (ref 3.5–5.2)
Sodium: 139 mmol/L (ref 134–144)
Total Protein: 7.1 g/dL (ref 6.0–8.5)
eGFR: 31 mL/min/{1.73_m2} — ABNORMAL LOW (ref >59)

## 2023-01-21 LAB — LIPID PANEL
Chol/HDL Ratio: 2.7 ratio (ref 0.0–4.4)
Cholesterol, Total: 116 mg/dL (ref 100–199)
HDL: 43 mg/dL (ref >39)
LDL Chol Calc (NIH): 48 mg/dL (ref 0–99)
Triglycerides: 148 mg/dL (ref 0–149)
VLDL Cholesterol Cal: 25 mg/dL (ref 5–40)

## 2023-01-21 NOTE — Addendum Note (Signed)
Addended by: Joni Reining on: 01/21/2023 10:24 AM   Modules accepted: Orders

## 2023-01-21 NOTE — Progress Notes (Signed)
Date:  01/21/2023   ID:  Traci Berger, DOB 04-Nov-1951, MRN [id number]   PCP:  Lawerance Cruel, MD  Cardiologist:  Fransico Him, MD Electrophysiologist:  None   Chief Complaint:  CAD, HTN, HLD  History of Present Illness:    Traci Berger is a 72 y.o. female with a hx of coronary artery calcifications noted on Chest CT done for lung screening. Nuclear stress test showed no ischemia and coronary Ca score was elevated at 220 .  She has a history of dyslipidemia, HTN and type 2 DM.   She is here today for followup and is doing well.  She denies any chest pain or pressure, SOB, DOE, PND, orthopnea, LE edema, dizziness, palpitations or syncope. She  is compliant with her meds and is tolerating meds with no SE.     Prior CV studies:   The following studies were reviewed today:  Lexiscan myoview and coronary Ca score in 2020  Past Medical History:  Diagnosis Date   Coronary artery calcification seen on CAT scan 02/08/2016   Diabetes mellitus without complication (HCC)    Hyperlipidemia    Hypertension    Interstitial cystitis    Kidney stones    Osteopenia    No past surgical history on file.   Current Meds  Medication Sig   amLODipine (NORVASC) 2.5 MG tablet Take 2.5 mg by mouth daily.   aspirin EC 81 MG tablet Take 81 mg by mouth daily.   cetirizine (ZYRTEC) 10 MG tablet Take 30 mg by mouth daily.   Cholecalciferol (D3-1000 PO) Take 1 tablet by mouth daily.   glimepiride (AMARYL) 4 MG tablet Take 4 mg by mouth in the morning and at bedtime. Use as directed.   metFORMIN (GLUCOPHAGE) 1000 MG tablet Take 1,000 mg by mouth 2 (two) times daily.   rosuvastatin (CRESTOR) 40 MG tablet TAKE 1 TABLET(40 MG) BY MOUTH DAILY   telmisartan (MICARDIS) 40 MG tablet Take 80 mg by mouth daily.   timolol (TIMOPTIC) 0.5 % ophthalmic solution INT 1 GTT IN OU BID     Allergies:   Other, Sulfa antibiotics, Sulfacetamide sodium, Nitrofurantoin macrocrystal, Ramipril, and Simvastatin    Social History   Tobacco Use   Smoking status: Every Day    Packs/day: 1.00    Years: 35.00    Total pack years: 35.00    Types: Cigarettes   Smokeless tobacco: Never   Tobacco comments:    Counseled to quit smoking.  Substance Use Topics   Alcohol use: No    Alcohol/week: 0.0 standard drinks of alcohol   Drug use: No     Family Hx: The patient's family history includes Arrhythmia in her mother; Colon cancer in her father. There is no history of Allergic rhinitis, Angioedema, Asthma, Eczema, Immunodeficiency, or Urticaria.  ROS:   Please see the history of present illness.     All other systems reviewed and are negative.   Labs/Other Tests and Data Reviewed:    Recent Labs: 01/22/2022: ALT 15; BUN 24; Creatinine, Ser 1.43; Potassium 4.2; Sodium 140   Recent Lipid Panel Lab Results  Component Value Date/Time   CHOL 113 01/22/2022 11:30 AM   TRIG 132 01/22/2022 11:30 AM   HDL 44 01/22/2022 11:30 AM   CHOLHDL 2.6 01/22/2022 11:30 AM   LDLCALC 46 01/22/2022 11:30 AM    Wt Readings from Last 3 Encounters:  01/21/23 151 lb (68.5 kg)  01/15/22 159 lb 3.2 oz (72.2 kg)  10/17/21 159  lb (72.1 kg)     Objective:    Vital Signs:  BP 128/62   Pulse 82   Ht 5' 2"$  (1.575 m)   Wt 151 lb (68.5 kg)   SpO2 97%   BMI 27.62 kg/m    GEN: Well nourished, well developed in no acute distress HEENT: Normal NECK: No JVD; No carotid bruits LYMPHATICS: No lymphadenopathy CARDIAC:RRR, no murmurs, rubs, gallops RESPIRATORY:  Clear to auscultation without rales, wheezing or rhonchi  ABDOMEN: Soft, non-tender, non-distended MUSCULOSKELETAL:  No edema; No deformity  SKIN: Warm and dry NEUROLOGIC:  Alert and oriented x 3 PSYCHIATRIC:  Normal affect   EKG was performed in the office today and showed NSR with no ST changes  ASSESSMENT & PLAN:    1.  Coronary artery calcifications -She denies any anginal symptoms -Her last Chest CT in 2019 showed multivessel coronary artery  calcifications -Lexi myoview 09/2019 showed no ischemic and normal LVF and coronary Ca score by CT was 220 -Continue aspirin and statin therapy  2  Hypertension  -BP controlled on exam today -Continue prescription drug management with amlodipine 2.5 mg daily and telmisartan 80 mg daily with as needed refills -check BMET  3.  Hyperlipidemia  -LDL goal < 70 -check FLP and ALT -Continue prescription drug management with rosuvastatin 40 mg daily with as needed refills  Followup with me in 1 year  Medication Adjustments/Labs and Tests Ordered: Current medicines are reviewed at length with the patient today.  Concerns regarding medicines are outlined above.  Tests Ordered: Orders Placed This Encounter  Procedures   EKG 12-Lead   Medication Changes: No orders of the defined types were placed in this encounter.   Disposition:  Follow up in 1 year(s)  Signed, Fransico Him, MD  01/21/2023 10:18 AM    [facility]

## 2023-01-21 NOTE — Patient Instructions (Signed)
Medication Instructions:  Your physician recommends that you continue on your current medications as directed. Please refer to the Current Medication list given to you today.  *If you need a refill on your cardiac medications before your next appointment, please call your pharmacy*   Lab Work: Please complete a CMET and a FASTING lipid panel in our lab today before your leave.  If you have labs (blood work) drawn today and your tests are completely normal, you will receive your results only by: [place] (if you have MyChart) OR A paper copy in the mail If you have any lab test that is abnormal or we need to change your treatment, we will call you to review the results.   Testing/Procedures: None.   Follow-Up: At [facility], you and your health needs are our priority.  As part of our continuing mission to provide you with exceptional heart care, we have created designated Provider Care Teams.  These Care Teams include your primary Cardiologist (physician) and Advanced Practice Providers (APPs -  Physician Assistants and Nurse Practitioners) who all work together to provide you with the care you need, when you need it.  We recommend signing up for the patient portal called "MyChart".  Sign up information is provided on this After Visit Summary.  MyChart is used to connect with patients for Virtual Visits (Telemedicine).  Patients are able to view lab/test results, encounter notes, upcoming appointments, etc.  Non-urgent messages can be sent to your provider as well.   To learn more about what you can do with MyChart, go to [email or web address].    Your next appointment:   1 year(s)  Provider:   Fransico Him, MD

## 2023-01-23 ENCOUNTER — Other Ambulatory Visit: Payer: Medicare Other

## 2023-01-28 DIAGNOSIS — N1832 Chronic kidney disease, stage 3b: Secondary | ICD-10-CM | POA: Diagnosis not present

## 2023-01-29 DIAGNOSIS — R801 Persistent proteinuria, unspecified: Secondary | ICD-10-CM | POA: Diagnosis not present

## 2023-01-29 DIAGNOSIS — J441 Chronic obstructive pulmonary disease with (acute) exacerbation: Secondary | ICD-10-CM | POA: Diagnosis not present

## 2023-01-29 DIAGNOSIS — N1832 Chronic kidney disease, stage 3b: Secondary | ICD-10-CM | POA: Diagnosis not present

## 2023-02-03 ENCOUNTER — Other Ambulatory Visit: Payer: Self-pay | Admitting: Family Medicine

## 2023-02-03 DIAGNOSIS — N1832 Chronic kidney disease, stage 3b: Secondary | ICD-10-CM

## 2023-02-05 DIAGNOSIS — H401131 Primary open-angle glaucoma, bilateral, mild stage: Secondary | ICD-10-CM | POA: Diagnosis not present

## 2023-02-13 DIAGNOSIS — R300 Dysuria: Secondary | ICD-10-CM | POA: Diagnosis not present

## 2023-02-24 ENCOUNTER — Ambulatory Visit
Admission: RE | Admit: 2023-02-24 | Discharge: 2023-02-24 | Disposition: A | Discharge status: Home / Self Care | Payer: Medicare Other | Source: Ambulatory Visit | Attending: Family Medicine | Admitting: Family Medicine

## 2023-02-24 ENCOUNTER — Other Ambulatory Visit: Payer: Medicare Other

## 2023-02-24 DIAGNOSIS — N1832 Chronic kidney disease, stage 3b: Secondary | ICD-10-CM

## 2023-02-24 DIAGNOSIS — N189 Chronic kidney disease, unspecified: Secondary | ICD-10-CM | POA: Diagnosis not present

## 2023-02-24 DIAGNOSIS — N281 Cyst of kidney, acquired: Secondary | ICD-10-CM | POA: Diagnosis not present

## 2023-03-22 ENCOUNTER — Other Ambulatory Visit: Payer: Self-pay | Admitting: Cardiology

## 2023-05-15 ENCOUNTER — Ambulatory Visit: Payer: Medicare Other | Admitting: Cardiology

## 2023-05-15 DIAGNOSIS — I700 Atherosclerosis of aorta: Secondary | ICD-10-CM | POA: Diagnosis not present

## 2023-05-15 DIAGNOSIS — J208 Acute bronchitis due to other specified organisms: Secondary | ICD-10-CM | POA: Diagnosis not present

## 2023-06-18 DIAGNOSIS — R69 Illness, unspecified: Secondary | ICD-10-CM | POA: Diagnosis not present

## 2023-06-24 ENCOUNTER — Other Ambulatory Visit: Payer: Self-pay | Admitting: Endocrinology

## 2023-06-24 DIAGNOSIS — E041 Nontoxic single thyroid nodule: Secondary | ICD-10-CM

## 2023-06-26 ENCOUNTER — Other Ambulatory Visit: Payer: Self-pay | Admitting: Family Medicine

## 2023-06-26 DIAGNOSIS — E11649 Type 2 diabetes mellitus with hypoglycemia without coma: Secondary | ICD-10-CM | POA: Diagnosis not present

## 2023-06-26 DIAGNOSIS — E11319 Type 2 diabetes mellitus with unspecified diabetic retinopathy without macular edema: Secondary | ICD-10-CM | POA: Diagnosis not present

## 2023-06-26 DIAGNOSIS — E1122 Type 2 diabetes mellitus with diabetic chronic kidney disease: Secondary | ICD-10-CM | POA: Diagnosis not present

## 2023-06-26 DIAGNOSIS — N1832 Chronic kidney disease, stage 3b: Secondary | ICD-10-CM | POA: Diagnosis not present

## 2023-06-26 DIAGNOSIS — H401131 Primary open-angle glaucoma, bilateral, mild stage: Secondary | ICD-10-CM | POA: Diagnosis not present

## 2023-06-26 DIAGNOSIS — G720 Drug-induced myopathy: Secondary | ICD-10-CM | POA: Diagnosis not present

## 2023-06-26 DIAGNOSIS — E782 Mixed hyperlipidemia: Secondary | ICD-10-CM | POA: Diagnosis not present

## 2023-06-26 DIAGNOSIS — I700 Atherosclerosis of aorta: Secondary | ICD-10-CM | POA: Diagnosis not present

## 2023-06-26 DIAGNOSIS — I2510 Atherosclerotic heart disease of native coronary artery without angina pectoris: Secondary | ICD-10-CM | POA: Diagnosis not present

## 2023-06-26 DIAGNOSIS — Z122 Encounter for screening for malignant neoplasm of respiratory organs: Secondary | ICD-10-CM

## 2023-06-26 DIAGNOSIS — I10 Essential (primary) hypertension: Secondary | ICD-10-CM | POA: Diagnosis not present

## 2023-06-26 DIAGNOSIS — F17210 Nicotine dependence, cigarettes, uncomplicated: Secondary | ICD-10-CM | POA: Diagnosis not present

## 2023-07-02 ENCOUNTER — Ambulatory Visit
Admission: RE | Admit: 2023-07-02 | Discharge: 2023-07-02 | Disposition: A | Discharge status: Home / Self Care | Payer: Medicare Other | Source: Ambulatory Visit | Attending: Endocrinology | Admitting: Endocrinology

## 2023-07-02 DIAGNOSIS — N1832 Chronic kidney disease, stage 3b: Secondary | ICD-10-CM | POA: Diagnosis not present

## 2023-07-02 DIAGNOSIS — E041 Nontoxic single thyroid nodule: Secondary | ICD-10-CM

## 2023-07-02 DIAGNOSIS — N184 Chronic kidney disease, stage 4 (severe): Secondary | ICD-10-CM | POA: Diagnosis not present

## 2023-07-09 DIAGNOSIS — R319 Hematuria, unspecified: Secondary | ICD-10-CM | POA: Diagnosis not present

## 2023-07-09 DIAGNOSIS — E041 Nontoxic single thyroid nodule: Secondary | ICD-10-CM | POA: Diagnosis not present

## 2023-07-09 DIAGNOSIS — Z780 Asymptomatic menopausal state: Secondary | ICD-10-CM | POA: Diagnosis not present

## 2023-07-09 DIAGNOSIS — M8580 Other specified disorders of bone density and structure, unspecified site: Secondary | ICD-10-CM | POA: Diagnosis not present

## 2023-07-09 DIAGNOSIS — I10 Essential (primary) hypertension: Secondary | ICD-10-CM | POA: Diagnosis not present

## 2023-07-09 DIAGNOSIS — N189 Chronic kidney disease, unspecified: Secondary | ICD-10-CM | POA: Diagnosis not present

## 2023-07-09 DIAGNOSIS — E7800 Pure hypercholesterolemia, unspecified: Secondary | ICD-10-CM | POA: Diagnosis not present

## 2023-07-09 DIAGNOSIS — R809 Proteinuria, unspecified: Secondary | ICD-10-CM | POA: Diagnosis not present

## 2023-07-09 DIAGNOSIS — N200 Calculus of kidney: Secondary | ICD-10-CM | POA: Diagnosis not present

## 2023-07-09 DIAGNOSIS — E1165 Type 2 diabetes mellitus with hyperglycemia: Secondary | ICD-10-CM | POA: Diagnosis not present

## 2023-07-24 ENCOUNTER — Ambulatory Visit
Admission: RE | Admit: 2023-07-24 | Discharge: 2023-07-24 | Disposition: A | Discharge status: Home / Self Care | Payer: Medicare Other | Source: Ambulatory Visit | Attending: Family Medicine | Admitting: Family Medicine

## 2023-07-24 DIAGNOSIS — F17210 Nicotine dependence, cigarettes, uncomplicated: Secondary | ICD-10-CM | POA: Diagnosis not present

## 2023-07-24 DIAGNOSIS — Z122 Encounter for screening for malignant neoplasm of respiratory organs: Secondary | ICD-10-CM

## 2023-07-30 DIAGNOSIS — H401131 Primary open-angle glaucoma, bilateral, mild stage: Secondary | ICD-10-CM | POA: Diagnosis not present

## 2023-08-12 DIAGNOSIS — E1165 Type 2 diabetes mellitus with hyperglycemia: Secondary | ICD-10-CM | POA: Diagnosis not present

## 2023-09-25 NOTE — Progress Notes (Signed)
[name] [address] [place] [address] Phone: [phone] Subjective:   Traci Berger, am serving as a scribe for Dr. Antoine Primas.  I'm seeing this patient by the request  of:  Daisy Floro, MD  CC: Left hip pain  [id number]  10/17/2021 Patient no longer has any significant weakness of the lower extremity. Is 100% better when he comes to pain but is very aware of it at the moment. Patient is doing well with the gabapentin and can continue to use this at night if it is helping with the sleep as well as any type of discomfort. Patient knows if any worsening symptoms we can repeat the injection if necessary. Follow-up with me on an as-needed basis. Encourage patient to restart the exercises with him in 6 weeks   Updated 09/26/2023 Traci Berger is a 72 y.o. female coming in with complaint of L upper leg pain for about 3-4 week. No numbness or tingling. Pain with standing up in the groin. Also having some pain on right back side below bra line when standing for too long.       Past Medical History:  Diagnosis Date   Coronary artery calcification seen on CAT scan 02/08/2016   Diabetes mellitus without complication (HCC)    Hyperlipidemia    Hypertension    Interstitial cystitis    Kidney stones    Osteopenia    No past surgical history on file. Social History   Socioeconomic History   Marital status: Married    Spouse name: Not on file   Number of children: Not on file   Years of education: Not on file   Highest education level: Not on file  Occupational History   Not on file  Tobacco Use   Smoking status: Every Day    Current packs/day: 1.00    Average packs/day: 1 pack/day for 35.0 years (35.0 ttl pk-yrs)    Types: Cigarettes   Smokeless tobacco: Never   Tobacco comments:    Counseled to quit smoking.  Substance and Sexual Activity   Alcohol use: No    Alcohol/week: 0.0 standard drinks of alcohol   Drug use:  No   Sexual activity: Not on file  Other Topics Concern   Not on file  Social History Narrative   Not on file   Social Determinants of Health   Financial Resource Strain: Not on file  Food Insecurity: Not on file  Transportation Needs: Not on file  Physical Activity: Not on file  Stress: Not on file  Social Connections: Not on file   Allergies  Allergen Reactions   Other Other (See Comments)    "bladder medication" unsure of name, makes her feel bad   Sulfa Antibiotics Nausea And Vomiting   Sulfacetamide Sodium Nausea And Vomiting   Nitrofurantoin Macrocrystal Nausea Only   Ramipril Other (See Comments)   Simvastatin Other (See Comments)   Family History  Problem Relation Age of Onset   Arrhythmia Mother    Colon cancer Father    Allergic rhinitis Neg Hx    Angioedema Neg Hx    Asthma Neg Hx    Eczema Neg Hx    Immunodeficiency Neg Hx    Urticaria Neg Hx     Current Outpatient Medications (Endocrine & Metabolic):    glimepiride (AMARYL) 4 MG tablet, Take 4 mg by mouth in the morning and at bedtime. Use as directed.   metFORMIN (GLUCOPHAGE) 1000 MG tablet, Take  1,000 mg by mouth 2 (two) times daily.  Current Outpatient Medications (Cardiovascular):    amLODipine (NORVASC) 2.5 MG tablet, Take 2.5 mg by mouth daily.   rosuvastatin (CRESTOR) 40 MG tablet, TAKE 1 TABLET(40 MG) BY MOUTH DAILY   telmisartan (MICARDIS) 40 MG tablet, Take 80 mg by mouth daily.  Current Outpatient Medications (Respiratory):    cetirizine (ZYRTEC) 10 MG tablet, Take 30 mg by mouth daily.  Current Outpatient Medications (Analgesics):    aspirin EC 81 MG tablet, Take 81 mg by mouth daily.   Current Outpatient Medications (Other):    Cholecalciferol (D3-1000 PO), Take 1 tablet by mouth daily.   timolol (TIMOPTIC) 0.5 % ophthalmic solution, INT 1 GTT IN OU BID   Reviewed prior external information including notes and imaging from  primary care provider As well as notes that were  available from care everywhere and other healthcare systems.  Past medical history, social, surgical and family history all reviewed in electronic medical record.  No pertanent information unless stated regarding to the chief complaint.   Review of Systems:  No headache, visual changes, nausea, vomiting, diarrhea, constipation, dizziness, abdominal pain, skin rash, fevers, chills, night sweats, weight loss, swollen lymph nodes, body aches, joint swelling, chest pain, shortness of breath, mood changes. POSITIVE muscle aches  Objective  Blood pressure 126/82, pulse 63, height 5\' 2"  (1.575 m), weight 140 lb (63.5 kg), SpO2 94%.   General: No apparent distress alert and oriented x3 mood and affect normal, dressed appropriately.  HEENT: Pupils equal, extraocular movements intact  Respiratory: Patient's speak in full sentences and does not appear short of breath  Cardiovascular: No lower extremity edema, non tender, no erythema  Left hip does have some loss lordosis noted.  Some tenderness to palpation noted.  This seems to have some decrease in internal range of motion of the hip.  Tightness with straight leg test but no true radicular symptoms.  Tender to palpation in the left side.  Has some mild back pain but nothing severe on exam.  No CVA tenderness   [id number]; 15 additional minutes spent for Therapeutic exercises as stated in above notes.  This included exercises focusing on stretching, strengthening, with significant focus on eccentric aspects.   Long term goals include an improvement in range of motion, strength, endurance as well as avoiding reinjury. Patient's frequency would include in 1-2 times a day, 3-5 times a week for a duration of 6-12 weeks. Hip strengthening exercises which included:  Pelvic tilt/bracing to help with proper recruitment of the lower abs and pelvic floor muscles  Glute strengthening to properly contract glutes without over-engaging low back and hamstrings - prone hip  extension and glute bridge exercises Proper stretching techniques to increase effectiveness for the hip flexors, groin, quads, piriformic and low back when appropriate    Proper technique shown and discussed handout in great detail with ATC.  All questions were discussed and answered.     Impression and Recommendations:     The above documentation has been reviewed and is accurate and complete Judi Saa, DO

## 2023-09-26 ENCOUNTER — Ambulatory Visit (INDEPENDENT_AMBULATORY_CARE_PROVIDER_SITE_OTHER): Payer: Medicare Other

## 2023-09-26 ENCOUNTER — Ambulatory Visit: Payer: Medicare Other | Admitting: Family Medicine

## 2023-09-26 ENCOUNTER — Encounter: Payer: Self-pay | Admitting: Family Medicine

## 2023-09-26 VITALS — BP 126/82 | HR 63 | Ht 62.0 in | Wt 140.0 lb

## 2023-09-26 DIAGNOSIS — M5416 Radiculopathy, lumbar region: Secondary | ICD-10-CM

## 2023-09-26 DIAGNOSIS — M160 Bilateral primary osteoarthritis of hip: Secondary | ICD-10-CM | POA: Diagnosis not present

## 2023-09-26 DIAGNOSIS — M25552 Pain in left hip: Secondary | ICD-10-CM | POA: Diagnosis not present

## 2023-09-26 DIAGNOSIS — M1612 Unilateral primary osteoarthritis, left hip: Secondary | ICD-10-CM | POA: Diagnosis not present

## 2023-09-26 NOTE — Patient Instructions (Addendum)
Do prescribed exercises at least 3x a week Vitamin D3 2000IU daily  Turmeric 500mg  daily  Tart cherry extract any dose at night  See you again in 2-3 months

## 2023-09-26 NOTE — Assessment & Plan Note (Signed)
Moderate to severe narrowing of the left hip noted.  Discussed icing regimen and home exercises, discussed which activities to do and which ones to avoid.  Discussed icing regimen.  Increase activity slowly otherwise.  Discussed lower impact exercises might be more beneficial.  Patient is in agreement and will try other conservative therapy at this time.  Follow-up with me again in 8 weeks to see how patient is responding.  Discussed vitamin supplementations.  Worsening pain may need to consider potential surgical intervention

## 2023-10-10 ENCOUNTER — Ambulatory Visit: Payer: Medicare Other | Admitting: Family Medicine

## 2023-11-04 DIAGNOSIS — E1165 Type 2 diabetes mellitus with hyperglycemia: Secondary | ICD-10-CM | POA: Diagnosis not present

## 2023-11-04 DIAGNOSIS — R809 Proteinuria, unspecified: Secondary | ICD-10-CM | POA: Diagnosis not present

## 2023-11-04 DIAGNOSIS — E7800 Pure hypercholesterolemia, unspecified: Secondary | ICD-10-CM | POA: Diagnosis not present

## 2023-11-04 DIAGNOSIS — E041 Nontoxic single thyroid nodule: Secondary | ICD-10-CM | POA: Diagnosis not present

## 2023-11-13 DIAGNOSIS — H6121 Impacted cerumen, right ear: Secondary | ICD-10-CM | POA: Diagnosis not present

## 2023-11-13 DIAGNOSIS — E11319 Type 2 diabetes mellitus with unspecified diabetic retinopathy without macular edema: Secondary | ICD-10-CM | POA: Diagnosis not present

## 2023-11-13 DIAGNOSIS — F17200 Nicotine dependence, unspecified, uncomplicated: Secondary | ICD-10-CM | POA: Diagnosis not present

## 2023-11-13 DIAGNOSIS — Z0000 Encounter for general adult medical examination without abnormal findings: Secondary | ICD-10-CM | POA: Diagnosis not present

## 2023-11-13 DIAGNOSIS — N1832 Chronic kidney disease, stage 3b: Secondary | ICD-10-CM | POA: Diagnosis not present

## 2023-11-13 DIAGNOSIS — F17210 Nicotine dependence, cigarettes, uncomplicated: Secondary | ICD-10-CM | POA: Diagnosis not present

## 2023-11-13 DIAGNOSIS — Z23 Encounter for immunization: Secondary | ICD-10-CM | POA: Diagnosis not present

## 2023-11-13 DIAGNOSIS — K5900 Constipation, unspecified: Secondary | ICD-10-CM | POA: Diagnosis not present

## 2023-11-13 DIAGNOSIS — D473 Essential (hemorrhagic) thrombocythemia: Secondary | ICD-10-CM | POA: Diagnosis not present

## 2023-11-13 DIAGNOSIS — E782 Mixed hyperlipidemia: Secondary | ICD-10-CM | POA: Diagnosis not present

## 2023-11-13 DIAGNOSIS — I10 Essential (primary) hypertension: Secondary | ICD-10-CM | POA: Diagnosis not present

## 2023-11-26 DIAGNOSIS — F17210 Nicotine dependence, cigarettes, uncomplicated: Secondary | ICD-10-CM | POA: Diagnosis not present

## 2023-11-26 DIAGNOSIS — J441 Chronic obstructive pulmonary disease with (acute) exacerbation: Secondary | ICD-10-CM | POA: Diagnosis not present

## 2023-11-26 DIAGNOSIS — E1121 Type 2 diabetes mellitus with diabetic nephropathy: Secondary | ICD-10-CM | POA: Diagnosis not present

## 2023-12-11 NOTE — Progress Notes (Signed)
 [facility] [address] [place] [address] Phone: [phone] Subjective:   LILLETTE Ashley Berger, am serving as a scribe for Dr. Arthea Claudene.  I'm seeing this patient by the request  of:  Okey Carlin Redbird, MD  CC: Left hip pain  [id number]  09/26/2023 Moderate to severe narrowing of the left hip noted.  Discussed icing regimen and home exercises, discussed which activities to do and which ones to avoid.  Discussed icing regimen.  Increase activity slowly otherwise.  Discussed lower impact exercises might be more beneficial.  Patient is in agreement and will try other conservative therapy at this time.  Follow-up with me again in 8 weeks to see how patient is responding.  Discussed vitamin supplementations.  Worsening pain may need to consider potential surgical intervention   Updated 12/15/2022 Traci Berger is a 73 y.o. female coming in with complaint of L hip pain, found to have moderate to severe arthritic changes.  Patient wants to change some of the activities including home exercises and doing more low impact exercises.  Patient states the leg is doing better, sometimes will feel a catch when getting up but it will go away pretty quickly.        Past Medical History:  Diagnosis Date   Coronary artery calcification seen on CAT scan 02/08/2016   Diabetes mellitus without complication (HCC)    Hyperlipidemia    Hypertension    Interstitial cystitis    Kidney stones    Osteopenia    No past surgical history on file. Social History   Socioeconomic History   Marital status: Married    Spouse name: Not on file   Number of children: Not on file   Years of education: Not on file   Highest education level: Not on file  Occupational History   Not on file  Tobacco Use   Smoking status: Every Day    Current packs/day: 1.00    Average packs/day: 1 pack/day for 35.0 years (35.0 ttl pk-yrs)    Types: Cigarettes   Smokeless tobacco:  Never   Tobacco comments:    Counseled to quit smoking.  Substance and Sexual Activity   Alcohol use: No    Alcohol/week: 0.0 standard drinks of alcohol   Drug use: No   Sexual activity: Not on file  Other Topics Concern   Not on file  Social History Narrative   Not on file   Social Drivers of Health   Financial Resource Strain: Not on file  Food Insecurity: Not on file  Transportation Needs: Not on file  Physical Activity: Not on file  Stress: Not on file  Social Connections: Not on file   Allergies  Allergen Reactions   Other Other (See Comments)    bladder medication unsure of name, makes her feel bad   Sulfa Antibiotics Nausea And Vomiting   Sulfacetamide Sodium Nausea And Vomiting   Nitrofurantoin Macrocrystal Nausea Only   Ramipril Other (See Comments)   Simvastatin Other (See Comments)   Family History  Problem Relation Age of Onset   Arrhythmia Mother    Colon cancer Father    Allergic rhinitis Neg Hx    Angioedema Neg Hx    Asthma Neg Hx    Eczema Neg Hx    Immunodeficiency Neg Hx    Urticaria Neg Hx     Current Outpatient Medications (Endocrine & Metabolic):    glimepiride (AMARYL) 4 MG tablet, Take 4 mg by mouth in  the morning and at bedtime. Use as directed.   metFORMIN (GLUCOPHAGE) 1000 MG tablet, Take 1,000 mg by mouth 2 (two) times daily.  Current Outpatient Medications (Cardiovascular):    amLODipine (NORVASC) 2.5 MG tablet, Take 2.5 mg by mouth daily.   rosuvastatin  (CRESTOR ) 40 MG tablet, TAKE 1 TABLET(40 MG) BY MOUTH DAILY   telmisartan (MICARDIS) 40 MG tablet, Take 80 mg by mouth daily.  Current Outpatient Medications (Respiratory):    cetirizine (ZYRTEC) 10 MG tablet, Take 30 mg by mouth daily.  Current Outpatient Medications (Analgesics):    aspirin EC 81 MG tablet, Take 81 mg by mouth daily.   Current Outpatient Medications (Other):    Cholecalciferol (D3-1000 PO), Take 1 tablet by mouth daily.   timolol (TIMOPTIC) 0.5 %  ophthalmic solution, INT 1 GTT IN OU BID   Objective  Blood pressure 122/60, pulse 68, height 5' 2 (1.575 m), weight 135 lb (61.2 kg), SpO2 98%.   General: No apparent distress alert and oriented x3 mood and affect normal, dressed appropriately.  HEENT: Pupils equal, extraocular movements intact  Respiratory: Patient's speak in full sentences and does not appear short of breath  Cardiovascular: No lower extremity edema, non tender, no erythema  Left hip exam shows decreased internal range of motion noted.  Tenderness to palpation diffusely noted.  No instability though noted.    Impression and Recommendations:     The above documentation has been reviewed and is accurate and complete Deontay Ladnier M Gotham Raden, DO

## 2023-12-16 ENCOUNTER — Ambulatory Visit: Payer: Medicare Other | Admitting: Family Medicine

## 2023-12-16 ENCOUNTER — Encounter: Payer: Self-pay | Admitting: Family Medicine

## 2023-12-16 VITALS — BP 122/60 | HR 68 | Ht 62.0 in | Wt 135.0 lb

## 2023-12-16 DIAGNOSIS — M1612 Unilateral primary osteoarthritis, left hip: Secondary | ICD-10-CM

## 2023-12-16 NOTE — Patient Instructions (Signed)
 Good to see you  You are doing fantastic Appointment in 4 months just in case  Other than that see me when you need me

## 2023-12-16 NOTE — Assessment & Plan Note (Signed)
 Patient is doing very well with the conservative therapy at this moment.  Discussed icing regimen and home exercises, discussed continuing to stay active at the moment.  Discussed if worsening symptoms we would change management but at the moment patient wants to continue with conservative therapy.  follow-up with me as needed

## 2023-12-24 ENCOUNTER — Other Ambulatory Visit: Payer: Self-pay | Admitting: *Deleted

## 2023-12-24 DIAGNOSIS — D225 Melanocytic nevi of trunk: Secondary | ICD-10-CM | POA: Diagnosis not present

## 2023-12-24 DIAGNOSIS — D2271 Melanocytic nevi of right lower limb, including hip: Secondary | ICD-10-CM | POA: Diagnosis not present

## 2023-12-24 DIAGNOSIS — L720 Epidermal cyst: Secondary | ICD-10-CM | POA: Diagnosis not present

## 2023-12-24 DIAGNOSIS — Z122 Encounter for screening for malignant neoplasm of respiratory organs: Secondary | ICD-10-CM

## 2023-12-24 DIAGNOSIS — D2272 Melanocytic nevi of left lower limb, including hip: Secondary | ICD-10-CM | POA: Diagnosis not present

## 2023-12-24 DIAGNOSIS — F17210 Nicotine dependence, cigarettes, uncomplicated: Secondary | ICD-10-CM

## 2023-12-24 DIAGNOSIS — D1801 Hemangioma of skin and subcutaneous tissue: Secondary | ICD-10-CM | POA: Diagnosis not present

## 2023-12-24 DIAGNOSIS — L245 Irritant contact dermatitis due to other chemical products: Secondary | ICD-10-CM | POA: Diagnosis not present

## 2023-12-24 DIAGNOSIS — L821 Other seborrheic keratosis: Secondary | ICD-10-CM | POA: Diagnosis not present

## 2023-12-24 DIAGNOSIS — L814 Other melanin hyperpigmentation: Secondary | ICD-10-CM | POA: Diagnosis not present

## 2023-12-24 DIAGNOSIS — D2262 Melanocytic nevi of left upper limb, including shoulder: Secondary | ICD-10-CM | POA: Diagnosis not present

## 2023-12-24 DIAGNOSIS — D2239 Melanocytic nevi of other parts of face: Secondary | ICD-10-CM | POA: Diagnosis not present

## 2023-12-24 DIAGNOSIS — D2261 Melanocytic nevi of right upper limb, including shoulder: Secondary | ICD-10-CM | POA: Diagnosis not present

## 2023-12-24 DIAGNOSIS — L918 Other hypertrophic disorders of the skin: Secondary | ICD-10-CM | POA: Diagnosis not present

## 2023-12-24 DIAGNOSIS — Z87891 Personal history of nicotine dependence: Secondary | ICD-10-CM

## 2023-12-25 DIAGNOSIS — H5203 Hypermetropia, bilateral: Secondary | ICD-10-CM | POA: Diagnosis not present

## 2023-12-25 DIAGNOSIS — H524 Presbyopia: Secondary | ICD-10-CM | POA: Diagnosis not present

## 2023-12-25 DIAGNOSIS — E119 Type 2 diabetes mellitus without complications: Secondary | ICD-10-CM | POA: Diagnosis not present

## 2023-12-25 DIAGNOSIS — H52223 Regular astigmatism, bilateral: Secondary | ICD-10-CM | POA: Diagnosis not present

## 2023-12-25 DIAGNOSIS — Z9849 Cataract extraction status, unspecified eye: Secondary | ICD-10-CM | POA: Diagnosis not present

## 2023-12-25 DIAGNOSIS — H401131 Primary open-angle glaucoma, bilateral, mild stage: Secondary | ICD-10-CM | POA: Diagnosis not present

## 2023-12-26 DIAGNOSIS — N1832 Chronic kidney disease, stage 3b: Secondary | ICD-10-CM | POA: Diagnosis not present

## 2023-12-26 DIAGNOSIS — D473 Essential (hemorrhagic) thrombocythemia: Secondary | ICD-10-CM | POA: Diagnosis not present

## 2024-01-12 DIAGNOSIS — R809 Proteinuria, unspecified: Secondary | ICD-10-CM | POA: Diagnosis not present

## 2024-01-12 DIAGNOSIS — M8580 Other specified disorders of bone density and structure, unspecified site: Secondary | ICD-10-CM | POA: Diagnosis not present

## 2024-01-12 DIAGNOSIS — Z780 Asymptomatic menopausal state: Secondary | ICD-10-CM | POA: Diagnosis not present

## 2024-01-12 DIAGNOSIS — I10 Essential (primary) hypertension: Secondary | ICD-10-CM | POA: Diagnosis not present

## 2024-01-12 DIAGNOSIS — E041 Nontoxic single thyroid nodule: Secondary | ICD-10-CM | POA: Diagnosis not present

## 2024-01-12 DIAGNOSIS — E7800 Pure hypercholesterolemia, unspecified: Secondary | ICD-10-CM | POA: Diagnosis not present

## 2024-01-12 DIAGNOSIS — E1165 Type 2 diabetes mellitus with hyperglycemia: Secondary | ICD-10-CM | POA: Diagnosis not present

## 2024-01-15 DIAGNOSIS — Z1231 Encounter for screening mammogram for malignant neoplasm of breast: Secondary | ICD-10-CM | POA: Diagnosis not present

## 2024-01-20 ENCOUNTER — Ambulatory Visit: Payer: Medicare Other | Admitting: Cardiology

## 2024-01-22 DIAGNOSIS — F17210 Nicotine dependence, cigarettes, uncomplicated: Secondary | ICD-10-CM | POA: Diagnosis not present

## 2024-01-22 DIAGNOSIS — N1832 Chronic kidney disease, stage 3b: Secondary | ICD-10-CM | POA: Diagnosis not present

## 2024-01-22 DIAGNOSIS — Z23 Encounter for immunization: Secondary | ICD-10-CM | POA: Diagnosis not present

## 2024-01-22 DIAGNOSIS — I10 Essential (primary) hypertension: Secondary | ICD-10-CM | POA: Diagnosis not present

## 2024-01-22 DIAGNOSIS — I700 Atherosclerosis of aorta: Secondary | ICD-10-CM | POA: Diagnosis not present

## 2024-01-22 DIAGNOSIS — H401131 Primary open-angle glaucoma, bilateral, mild stage: Secondary | ICD-10-CM | POA: Diagnosis not present

## 2024-01-22 DIAGNOSIS — J432 Centrilobular emphysema: Secondary | ICD-10-CM | POA: Diagnosis not present

## 2024-01-22 DIAGNOSIS — M8580 Other specified disorders of bone density and structure, unspecified site: Secondary | ICD-10-CM | POA: Diagnosis not present

## 2024-01-22 DIAGNOSIS — E1121 Type 2 diabetes mellitus with diabetic nephropathy: Secondary | ICD-10-CM | POA: Diagnosis not present

## 2024-01-22 DIAGNOSIS — I2510 Atherosclerotic heart disease of native coronary artery without angina pectoris: Secondary | ICD-10-CM | POA: Diagnosis not present

## 2024-01-26 DIAGNOSIS — N200 Calculus of kidney: Secondary | ICD-10-CM | POA: Diagnosis not present

## 2024-02-21 ENCOUNTER — Other Ambulatory Visit: Payer: Self-pay | Admitting: Cardiology

## 2024-03-16 ENCOUNTER — Ambulatory Visit: Payer: Medicare Other | Admitting: Cardiology

## 2024-03-22 ENCOUNTER — Ambulatory Visit: Attending: Cardiology | Admitting: Cardiology

## 2024-03-22 ENCOUNTER — Encounter: Payer: Self-pay | Admitting: Cardiology

## 2024-03-22 VITALS — BP 118/64 | HR 52 | Ht 61.5 in | Wt 132.8 lb

## 2024-03-22 DIAGNOSIS — I10 Essential (primary) hypertension: Secondary | ICD-10-CM | POA: Diagnosis not present

## 2024-03-22 DIAGNOSIS — I2510 Atherosclerotic heart disease of native coronary artery without angina pectoris: Secondary | ICD-10-CM | POA: Diagnosis not present

## 2024-03-22 DIAGNOSIS — E7800 Pure hypercholesterolemia, unspecified: Secondary | ICD-10-CM

## 2024-03-22 NOTE — Addendum Note (Signed)
 Addended by: [place] Festa L on: 03/22/2024 03:41 PM   Modules accepted: Orders

## 2024-03-22 NOTE — Patient Instructions (Signed)
 Medication Instructions:  Your physician recommends that you continue on your current medications as directed. Please refer to the Current Medication list given to you today.  *If you need a refill on your cardiac medications before your next appointment, please call your pharmacy*  Lab Work: Have lab work done today at [facility] on the first floor --CMET and Lipids   If you have labs (blood work) drawn today and your tests are completely normal, you will receive your results only by: [employer] (if you have MyChart) OR A paper copy in the mail If you have any lab test that is abnormal or we need to change your treatment, we will call you to review the results.  Testing/Procedures: none  Follow-Up: At [facility], you and your health needs are our priority.  As part of our continuing mission to provide you with exceptional heart care, our providers are all part of one team.  This team includes your primary Cardiologist (physician) and Advanced Practice Providers or APPs (Physician Assistants and Nurse Practitioners) who all work together to provide you with the care you need, when you need it.  Your next appointment:   12 month(s)  Provider:   Gaylyn Keas, MD     We recommend signing up for the patient portal called "MyChart".  Sign up information is provided on this After Visit Summary.  MyChart is used to connect with patients for Virtual Visits (Telemedicine).  Patients are able to view lab/test results, encounter notes, upcoming appointments, etc.  Non-urgent messages can be sent to your provider as well.   To learn more about what you can do with MyChart, go to [email or web address].   Other Instructions        1st Floor: - Lobby - Registration  - Pharmacy  - Lab - Cafe  2nd Floor: - PV Lab - Diagnostic Testing (echo, CT, nuclear med)  3rd Floor: - Vacant  4th Floor: - TCTS (cardiothoracic surgery) - AFib Clinic - Structural Heart Clinic -  Vascular Surgery  - Vascular Ultrasound  5th Floor: - HeartCare Cardiology (general and EP) - Clinical Pharmacy for coumadin, hypertension, lipid, weight-loss medications, and med management appointments    Valet parking services will be available as well.

## 2024-03-22 NOTE — Progress Notes (Signed)
 Date:  03/22/2024   ID:  Gelene Mink, DOB 1951-07-03, MRN [id number]   PCP:  Daisy Floro, MD  Cardiologist:  Armanda Magic, MD Electrophysiologist:  None   Chief Complaint:  CAD, HTN, HLD  History of Present Illness:    Traci Berger is a 73 y.o. female with a hx of coronary artery calcifications noted on Chest CT done for lung screening. Nuclear stress test showed no ischemia and coronary Ca score was elevated at 220 .  She has a history of dyslipidemia, HTN and type 2 DM.   She is here today for followup and is doing well.  She denies any chest pain or pressure, SOB, DOE, PND, orthopnea, LE edema, dizziness or syncope.  She had 1 episode of palpitations that only lasted a minute and resolved.  She has not had any other episodes of palpitations. She is compliant with her meds and is tolerating meds with no SE.    Prior CV studies:   The following studies were reviewed today:  Lexiscan myoview and coronary Ca score in 2020  Past Medical History:  Diagnosis Date   Coronary artery calcification seen on CAT scan 02/08/2016   Diabetes mellitus without complication (HCC)    Hyperlipidemia    Hypertension    Interstitial cystitis    Kidney stones    Osteopenia    History reviewed. No pertinent surgical history.   Current Meds  Medication Sig   amLODipine (NORVASC) 2.5 MG tablet Take 2.5 mg by mouth daily.   aspirin EC 81 MG tablet Take 81 mg by mouth daily.   Cholecalciferol (D3-1000 PO) Take 1 tablet by mouth daily.   Continuous Glucose Sensor (DEXCOM G7 SENSOR) MISC every 10 days   FARXIGA 10 MG TABS tablet Take 10 mg by mouth daily.   rosuvastatin (CRESTOR) 40 MG tablet Take 1 tablet (40 mg total) by mouth daily.   telmisartan (MICARDIS) 40 MG tablet Take 80 mg by mouth daily.   timolol (TIMOPTIC) 0.5 % ophthalmic solution INT 1 GTT IN OU BID     Allergies:   Other, Sulfa antibiotics, Sulfacetamide sodium, Nitrofurantoin macrocrystal, Ramipril, and  Simvastatin   Social History   Tobacco Use   Smoking status: Every Day    Current packs/day: 1.00    Average packs/day: 1 pack/day for 35.0 years (35.0 ttl pk-yrs)    Types: Cigarettes   Smokeless tobacco: Never   Tobacco comments:    Counseled to quit smoking.  Substance Use Topics   Alcohol use: No    Alcohol/week: 0.0 standard drinks of alcohol   Drug use: No     Family Hx: The patient's family history includes Arrhythmia in her mother; Colon cancer in her father. There is no history of Allergic rhinitis, Angioedema, Asthma, Eczema, Immunodeficiency, or Urticaria.  ROS:   Please see the history of present illness.     All other systems reviewed and are negative.   Labs/Other Tests and Data Reviewed:    EKG Interpretation Date/Time:  Monday March 22 2024 15:17:10 EDT Ventricular Rate:  52 PR Interval:  126 QRS Duration:  82 QT Interval:  442 QTC Calculation: 411 R Axis:   52  Text Interpretation: Sinus bradycardia Low voltage QRS No previous ECGs available Confirmed by Armanda Magic ([id number]) on 03/22/2024 3:25:17 PM    Recent Labs: No results found for requested labs within last 365 days.   Recent Lipid Panel Lab Results  Component Value Date/Time   CHOL 116 01/21/2023  10:29 AM   TRIG 148 01/21/2023 10:29 AM   HDL 43 01/21/2023 10:29 AM   CHOLHDL 2.7 01/21/2023 10:29 AM   LDLCALC 48 01/21/2023 10:29 AM    Wt Readings from Last 3 Encounters:  03/22/24 132 lb 12.8 oz (60.2 kg)  12/16/23 135 lb (61.2 kg)  09/26/23 140 lb (63.5 kg)     Objective:    Vital Signs:  BP 118/64   Pulse (!) 52   Ht 5' 1.5" (1.562 m)   Wt 132 lb 12.8 oz (60.2 kg)   SpO2 96%   BMI 24.69 kg/m    GEN: Well nourished, well developed in no acute distress HEENT: Normal NECK: No JVD; No carotid bruits LYMPHATICS: No lymphadenopathy CARDIAC:RRR, no murmurs, rubs, gallops RESPIRATORY:  Clear to auscultation without rales, wheezing or rhonchi  ABDOMEN: Soft, non-tender,  non-distended MUSCULOSKELETAL:  No edema; No deformity  SKIN: Warm and dry NEUROLOGIC:  Alert and oriented x 3 PSYCHIATRIC:  Normal affect   ASSESSMENT & PLAN:    #Coronary artery calcifications -Chest CT in 2019 showed multivessel coronary artery calcifications -Lexi myoview 09/2019 showed no ischemic and normal LVF and coronary Ca score by CT was 220 -She has not had any anginal symptoms since I saw her last -Continue on 81 mg daily and Crestor 40 mg daily with as needed refills  #Hypertension  - Her BP is well-controlled on exam today - Continue you amlodipine 2.5 mg daily and telmisartan 80 mg daily with as needed refills - check BMET  #Hyperlipidemia  - LDL goal < 70 - Check FLP and ALT - Continue Crestor 40 mg daily with as needed refills  Followup with me in 1 year  Medication Adjustments/Labs and Tests Ordered: Current medicines are reviewed at length with the patient today.  Concerns regarding medicines are outlined above.  Tests Ordered: Orders Placed This Encounter  Procedures   EKG 12-Lead   Medication Changes: No orders of the defined types were placed in this encounter.   Disposition:  Follow up in 1 year(s)  Signed, Gaylyn Keas, MD  03/22/2024 3:26 PM    [facility]

## 2024-03-23 LAB — COMPREHENSIVE METABOLIC PANEL WITH GFR
ALT: 9 IU/L (ref 0–32)
AST: 18 IU/L (ref 0–40)
Albumin: 4.3 g/dL (ref 3.8–4.8)
Alkaline Phosphatase: 79 IU/L (ref 44–121)
BUN/Creatinine Ratio: 19 (ref 12–28)
BUN: 30 mg/dL — ABNORMAL HIGH (ref 8–27)
Bilirubin Total: 0.3 mg/dL (ref 0.0–1.2)
CO2: 17 mmol/L — ABNORMAL LOW (ref 20–29)
Calcium: 9.5 mg/dL (ref 8.7–10.3)
Chloride: 108 mmol/L — ABNORMAL HIGH (ref 96–106)
Creatinine, Ser: 1.61 mg/dL — ABNORMAL HIGH (ref 0.57–1.00)
Globulin, Total: 2.3 g/dL (ref 1.5–4.5)
Glucose: 88 mg/dL (ref 70–99)
Potassium: 4.7 mmol/L (ref 3.5–5.2)
Sodium: 140 mmol/L (ref 134–144)
Total Protein: 6.6 g/dL (ref 6.0–8.5)
eGFR: 33 mL/min/{1.73_m2} — ABNORMAL LOW (ref >59)

## 2024-03-23 LAB — LIPID PANEL
Chol/HDL Ratio: 3 ratio (ref 0.0–4.4)
Cholesterol, Total: 154 mg/dL (ref 100–199)
HDL: 52 mg/dL (ref >39)
LDL Chol Calc (NIH): 85 mg/dL (ref 0–99)
Triglycerides: 88 mg/dL (ref 0–149)
VLDL Cholesterol Cal: 17 mg/dL (ref 5–40)

## 2024-03-24 ENCOUNTER — Telehealth: Payer: Self-pay

## 2024-03-24 DIAGNOSIS — E7800 Pure hypercholesterolemia, unspecified: Secondary | ICD-10-CM

## 2024-03-24 MED ORDER — EZETIMIBE 10 MG PO TABS: 10.0000 mg | ORAL_TABLET | Freq: Every day | ORAL | 3 refills | Status: DC

## 2024-03-24 NOTE — Telephone Encounter (Signed)
 Spoke with patient and discussed lab results per Dr. Micael Adas:  Kidney function abnormal but stable compared to a year ago.   LDL not at goal.  Continue Crestor and add Zetia 10mg  daily and repeat FLP and ALT In 6 weeks   Zetia sent to [facility] and labs ordered/released to [facility].  Patient verbalized understanding and expressed appreciation for call.

## 2024-04-09 NOTE — Progress Notes (Unsigned)
 [facility] [address] [place] [address] Phone: [phone] Subjective:   Traci Berger am a scribe for Dr. Felipe Horton.   I'm seeing this patient by the request  of:  Jimmey Mould, MD  CC: Left hip pain  DGL:[id number]  12/16/2023 Patient is doing very well with the conservative therapy at this moment.  Discussed icing regimen and home exercises, discussed continuing to stay active at the moment.  Discussed if worsening symptoms we would change management but at the moment patient wants to continue with conservative therapy.  follow-up with me as needed     Updated 04/13/2024 Traci Berger is a 73 y.o. female coming in with complaint of L hip pain. Patient states that she feels ok today but have some questions for the doctor today. Husband wanted her to come today because he thinks that something needs to be done. She states that she is managing it well know that she has figured out how to get out of the chair without pain.       Past Medical History:  Diagnosis Date   Coronary artery calcification seen on CAT scan 02/08/2016   Diabetes mellitus without complication (HCC)    Hyperlipidemia    Hypertension    Interstitial cystitis    Kidney stones    Osteopenia    No past surgical history on file. Social History   Socioeconomic History   Marital status: Married    Spouse name: Not on file   Number of children: Not on file   Years of education: Not on file   Highest education level: Not on file  Occupational History   Not on file  Tobacco Use   Smoking status: Every Day    Current packs/day: 1.00    Average packs/day: 1 pack/day for 35.0 years (35.0 ttl pk-yrs)    Types: Cigarettes   Smokeless tobacco: Never   Tobacco comments:    Counseled to quit smoking.  Substance and Sexual Activity   Alcohol use: No    Alcohol/week: 0.0 standard drinks of alcohol   Drug use: No   Sexual activity: Not on file  Other Topics  Concern   Not on file  Social History Narrative   Not on file   Social Drivers of Health   Financial Resource Strain: Not on file  Food Insecurity: Not on file  Transportation Needs: Not on file  Physical Activity: Not on file  Stress: Not on file  Social Connections: Not on file   Allergies  Allergen Reactions   Other Other (See Comments)    "bladder medication" unsure of name, makes her feel bad   Sulfa Antibiotics Nausea And Vomiting   Sulfacetamide Sodium Nausea And Vomiting   Nitrofurantoin Macrocrystal Nausea Only   Ramipril Other (See Comments)   Simvastatin Other (See Comments)   Family History  Problem Relation Age of Onset   Arrhythmia Mother    Colon cancer Father    Allergic rhinitis Neg Hx    Angioedema Neg Hx    Asthma Neg Hx    Eczema Neg Hx    Immunodeficiency Neg Hx    Urticaria Neg Hx     Current Outpatient Medications (Endocrine & Metabolic):    FARXIGA 10 MG TABS tablet, Take 10 mg by mouth daily.  Current Outpatient Medications (Cardiovascular):    amLODipine (NORVASC) 2.5 MG tablet, Take 2.5 mg by mouth daily.   ezetimibe  (ZETIA ) 10 MG tablet, Take 1 tablet (10  mg total) by mouth daily.   rosuvastatin  (CRESTOR ) 40 MG tablet, Take 1 tablet (40 mg total) by mouth daily.   telmisartan (MICARDIS) 40 MG tablet, Take 80 mg by mouth daily.   Current Outpatient Medications (Analgesics):    aspirin EC 81 MG tablet, Take 81 mg by mouth daily.   Current Outpatient Medications (Other):    Cholecalciferol (D3-1000 PO), Take 1 tablet by mouth daily.   Continuous Glucose Sensor (DEXCOM G7 SENSOR) MISC, every 10 days   timolol (TIMOPTIC) 0.5 % ophthalmic solution, INT 1 GTT IN OU BID   Reviewed prior external information including notes and imaging from  primary care provider As well as notes that were available from care everywhere and other healthcare systems.  Past medical history, social, surgical and family history all reviewed in electronic  medical record.  No pertanent information unless stated regarding to the chief complaint.   Review of Systems:  No headache, visual changes, nausea, vomiting, diarrhea, constipation, dizziness, abdominal pain, skin rash, fevers, chills, night sweats, weight loss, swollen lymph nodes, body aches, joint swelling, chest pain, shortness of breath, mood changes. POSITIVE muscle aches  Objective  Blood pressure (!) 142/60, pulse (!) 56, height 5' 1.5" (1.562 m), weight 133 lb 12.8 oz (60.7 kg), SpO2 96%.   General: No apparent distress alert and oriented x3 mood and affect normal, dressed appropriately.  HEENT: Pupils equal, extraocular movements intact  Respiratory: Patient's speak in full sentences and does not appear short of breath  Cardiovascular: No lower extremity edema, non tender, no erythema  Significant improvement noted.  Negative straight leg test noted.  Tightness noted with paraspinal musculature.  Patient is able to get up from a sitting position without any significant difficulty.  Still has some limited internal range of motion compared to contralateral side    Impression and Recommendations:

## 2024-04-13 ENCOUNTER — Encounter: Payer: Self-pay | Admitting: Family Medicine

## 2024-04-13 ENCOUNTER — Ambulatory Visit: Payer: Medicare Other | Admitting: Family Medicine

## 2024-04-13 VITALS — BP 142/60 | HR 56 | Ht 61.5 in | Wt 133.8 lb

## 2024-04-13 DIAGNOSIS — M1612 Unilateral primary osteoarthritis, left hip: Secondary | ICD-10-CM

## 2024-04-13 NOTE — Patient Instructions (Addendum)
 Good to see you. Handicap Placard. Exercises for hips. Increase tart cherry to 24 mg. Continue Vitamin D at same dosage. Continue walks 2 to 3 times a week.  Return in 4 to 6 months.

## 2024-04-13 NOTE — Assessment & Plan Note (Signed)
 Moderate arthritis of the hip.  Continue to monitor.  Discussed if worsening pain would consider the possibility of an MRI with patient failing all other conservative therapy.  Depending on the MRI would decide if any type of surgical intervention would be necessary.  Patient will follow-up with me again in 3 months if needed.

## 2024-04-19 ENCOUNTER — Telehealth: Payer: Self-pay | Admitting: Cardiology

## 2024-04-19 DIAGNOSIS — E7800 Pure hypercholesterolemia, unspecified: Secondary | ICD-10-CM

## 2024-04-19 DIAGNOSIS — I2510 Atherosclerotic heart disease of native coronary artery without angina pectoris: Secondary | ICD-10-CM

## 2024-04-19 NOTE — Telephone Encounter (Signed)
 Pt c/o medication issue:  1. Name of Medication:   ezetimibe  (ZETIA ) 10 MG tablet    2. How are you currently taking this medication (dosage and times per day)?  Take 1 tablet (10 mg total) by mouth daily.       3. Are you having a reaction (difficulty breathing--STAT)? No  4. What is your medication issue? Pt is requesting a callback regarding her stating she'd like to speak with a nurse about her having some big issues with taking this medication but she refused to elaborate. Please advise

## 2024-04-19 NOTE — Telephone Encounter (Signed)
 Spoke with pt, she reports that she does not want to take ezetimibe  anymore. She reports it caused pain in her shoulders and she is not going to take it. Diet information mailed to the patient and she will wait 2 months prior to getting lab work checked. Will make dr turner aware.

## 2024-04-19 NOTE — Telephone Encounter (Signed)
 Call to patient to offer lipid referral, patient accepts referral, referral placed.

## 2024-05-04 ENCOUNTER — Ambulatory Visit: Admitting: Cardiology

## 2024-05-06 ENCOUNTER — Other Ambulatory Visit: Payer: Self-pay | Admitting: Cardiology

## 2024-06-07 DIAGNOSIS — N1832 Chronic kidney disease, stage 3b: Secondary | ICD-10-CM | POA: Diagnosis not present

## 2024-06-07 DIAGNOSIS — E11649 Type 2 diabetes mellitus with hypoglycemia without coma: Secondary | ICD-10-CM | POA: Diagnosis not present

## 2024-06-07 DIAGNOSIS — H401131 Primary open-angle glaucoma, bilateral, mild stage: Secondary | ICD-10-CM | POA: Diagnosis not present

## 2024-06-07 DIAGNOSIS — E782 Mixed hyperlipidemia: Secondary | ICD-10-CM | POA: Diagnosis not present

## 2024-06-09 ENCOUNTER — Encounter: Payer: Self-pay | Admitting: Pharmacist

## 2024-06-09 ENCOUNTER — Ambulatory Visit: Attending: Cardiology | Admitting: Pharmacist

## 2024-06-09 DIAGNOSIS — Z720 Tobacco use: Secondary | ICD-10-CM | POA: Diagnosis not present

## 2024-06-09 DIAGNOSIS — E7800 Pure hypercholesterolemia, unspecified: Secondary | ICD-10-CM | POA: Diagnosis not present

## 2024-06-09 NOTE — Patient Instructions (Addendum)
 Catalina Crunch Continue rosuvastatin  40mg  daily Increase exercise  Hyperlipidemia Foods high in saturated fat tend to increase LDL (bad) cholesterol the most.  Not all fat is bad fat! Foods higher in unsaturated fat are healthy, like fish, nuts, and avocadoes. Overall, following a diet like the Mediterranean diet can help to improve your cholesterol. Hypertriglyceridemia Foods high in carbohydrates and sugar, as well as alcohol, can increase your triglycerides. If you are diabetic, poorly controlled blood sugar can also increase your triglycerides. A non-fasting state can affect the triglyceride level in your lab work. Please make sure you are fasting to improve accuracy of this lab test.  Eat more of these Eat less of these  Carbohydrates Fiber-rich whole grains: oats, whole wheat pasta or bread, quinoa, barley, oats and brown rice Aim for  of your plate to be whole grains Men: aim for > 38 grams of fiber per day Women: aim for > 25 grams of fiber per day Refined grains: white bread, rice, or pasta, macaroni and cheese Foods with added sugar Processed foods: desserts like cake, cookies, donuts, muffins, and pastries; microwave meals, chips, [place] fries  Fruits and vegetables A variety of bright colored fruits and vegetables: spinach, broccoli, tomatoes, carrots, berries,  oranges, apples, bananas, berries, and melon Aim for  of your plate to be fruits/vegetables Canned vegetables Starchy vegetables like potatoes Canned fruit in heavy syrup  Protein Lean meat: skinless chicken or [place] Fish: salmon, trout, tuna, cod, tilapia, flounder, etc Legumes: beans, lentils, chickpeas, tofu, nuts Aim for  of your plate to be protein Red, fatty, or fried meat Processed foods: deli meat, hot dogs, burgers, pizza, fast food   Dairy, fats and oils Unsaturated fats: fish, nuts, and avocadoes  Low fat or fat free milk or yogurt Olive and canola oil Saturated fats: butter, lard, cream, coconut  oil Whole milk and other full fat dairy products like cheese Sugar-sweetened dairy products (many yogurts have added sugar)  Drinks Water: plain or sparkling Sugar free or diet drinks Unsweet tea or coffee Keep added sugar intake to  < 6 teaspoons (24 grams) Regular soda Fruit juice Alcohol  Other ways to adopt a healthy lifestyle:  Exercise:  Exercise: Aim for 150 min of moderate intensity exercise weekly for heart health, and weights twice weekly for bone health. Stay active - any steps are better than no steps!  Sleep: Aim for 7-9 hours of sleep nightly.  Weight: Know what a healthy weight is for you (roughly BMI <25) and aim to maintain this. Unfortunately, this is not the most accurate measure of healthy weight, but it is the simplest measurement to use. A more accurate measurement involves body scanning which measures lean muscle, fat tissue and bony density. We do not have this equipment at [facility].

## 2024-06-09 NOTE — Assessment & Plan Note (Signed)
 Assessment: Latest LDL-C was above goal of less than 70 Previously well-controlled on rosuvastatin  40 mg daily Patient intolerant to ezetimibe  10 mg daily Does not wish to make any medication changes Expressed interest in making dietary and lifestyle changes Diet reviewed in detail-reviewed foods high in saturated fat and encouraged increasing fiber intake Still smoking about 10 sometimes little more cigarettes per day Alcohol use is sparing Was walking about a mile a day but has not been doing this recently  Plan: Implement lifestyle plans discussed We will check labs October 21 We will schedule patient for October 28 at 1:45 to discuss labs Follow-up via telephone in about a month to check on patient's progress

## 2024-06-09 NOTE — Assessment & Plan Note (Signed)
 Assessment: Patient smoking 10 cigarettes or more per day She expressed a desire to quit Not interested in pharmacotherapy

## 2024-06-09 NOTE — Progress Notes (Signed)
 Patient ID: Traci Berger                 DOB: 02/13/1951                    MRN: [id number]      HPI: Traci Berger is a 73 y.o. female patient referred to lipid clinic by Dr. Shlomo. PMH is significant for coronary artery calcifications noted on Chest CT done for lung screening. Nuclear stress test showed no ischemia and coronary Ca score was elevated at 220. She has a history of dyslipidemia, HTN and type 2 DM.   Previously patient's LDL-C was well-controlled on rosuvastatin  40 mg daily.  Her lipid panel in April showed an LDL-C of 85.  Dr. Shlomo started her on ezetimibe  however patient experienced muscle pains and stopped.  She was referred to lipid clinic to discuss options.  Patient presents today to lipid clinic.  Initially unsure of why she was sent.  She reports that she does not want to add any medications.  She would like to discuss lifestyle changes she can make.   Current Medications: rosuvastatin  40mg  daily Intolerances: Ezetimibe  (muscle pains) Risk Factors: Diabetes, positive coronary calcium  score, hypertension, tobacco use LDL-C goal: <70 ApoB goal:   Diet:  Breakfast: 1 egg, sausage patty, 1 piece of toast Lunch: apple with PB or 1/2 PB sandwich, or cheese Dinner:  Husband does deep fried foods, cooks on [place]  Exercise: was walking 1 mile per day  Family History:   Social History: down to 10 cig per day, 1-2 drinks per week  Labs: Lipid Panel     Component Value Date/Time   CHOL 154 03/22/2024 1559   TRIG 88 03/22/2024 1559   HDL 52 03/22/2024 1559   CHOLHDL 3.0 03/22/2024 1559   LDLCALC 85 03/22/2024 1559   LABVLDL 17 03/22/2024 1559    Past Medical History:  Diagnosis Date   Coronary artery calcification seen on CAT scan 02/08/2016   Diabetes mellitus without complication (HCC)    Hyperlipidemia    Hypertension    Interstitial cystitis    Kidney stones    Osteopenia     Current Outpatient Medications on File Prior to Visit   Medication Sig Dispense Refill   amLODipine (NORVASC) 2.5 MG tablet Take 2.5 mg by mouth daily.     telmisartan (MICARDIS) 40 MG tablet Take 80 mg by mouth daily.     aspirin EC 81 MG tablet Take 81 mg by mouth daily.     Cholecalciferol (D3-1000 PO) Take 1 tablet by mouth daily.     Continuous Glucose Sensor (DEXCOM G7 SENSOR) MISC every 10 days     FARXIGA 10 MG TABS tablet Take 10 mg by mouth daily.     rosuvastatin  (CRESTOR ) 40 MG tablet TAKE 1 TABLET(40 MG) BY MOUTH DAILY 90 tablet 3   timolol (TIMOPTIC) 0.5 % ophthalmic solution INT 1 GTT IN OU BID  4   No current facility-administered medications on file prior to visit.    Allergies  Allergen Reactions   Other Other (See Comments)    bladder medication unsure of name, makes her feel bad   Sulfa Antibiotics Nausea And Vomiting   Sulfacetamide Sodium Nausea And Vomiting   Nitrofurantoin Macrocrystal Nausea Only   Ramipril Other (See Comments)   Simvastatin Other (See Comments)    Assessment/Plan:  1. Hyperlipidemia -  Pure hypercholesterolemia Assessment: Latest LDL-C was above goal of less than 70 Previously well-controlled on  rosuvastatin  40 mg daily Patient intolerant to ezetimibe  10 mg daily Does not wish to make any medication changes Expressed interest in making dietary and lifestyle changes Diet reviewed in detail-reviewed foods high in saturated fat and encouraged increasing fiber intake Still smoking about 10 sometimes little more cigarettes per day Alcohol use is sparing Was walking about a mile a day but has not been doing this recently  Plan: Implement lifestyle plans discussed We will check labs October 21 We will schedule patient for October 28 at 1:45 to discuss labs Follow-up via telephone in about a month to check on patient's progress  Current tobacco use Assessment: Patient smoking 10 cigarettes or more per day She expressed a desire to quit Not interested in pharmacotherapy    Thank  you,  Caellum Mancil D Aailyah Dunbar, Pharm.JONETTA SARAN, CPP [facility] A Division of [facility] [address], [place], [place] [address]  Phone: [phone]; Fax: [phone]

## 2024-06-25 ENCOUNTER — Other Ambulatory Visit: Payer: Self-pay | Admitting: Endocrinology

## 2024-06-25 DIAGNOSIS — E041 Nontoxic single thyroid nodule: Secondary | ICD-10-CM

## 2024-06-28 DIAGNOSIS — H401131 Primary open-angle glaucoma, bilateral, mild stage: Secondary | ICD-10-CM | POA: Diagnosis not present

## 2024-07-07 ENCOUNTER — Telehealth: Payer: Self-pay | Admitting: Pharmacist

## 2024-07-07 NOTE — Telephone Encounter (Signed)
 Call patient to check in on her lifestyle changes.  Reports that she is cut back on her sausage and egg intake.  Trying to cut back on fried foods.  She is exercising some but having issues with her knee.  I did recommend some pool exercises.  Follow-up after labs in October.

## 2024-07-08 DIAGNOSIS — N1832 Chronic kidney disease, stage 3b: Secondary | ICD-10-CM | POA: Diagnosis not present

## 2024-07-08 DIAGNOSIS — N200 Calculus of kidney: Secondary | ICD-10-CM | POA: Diagnosis not present

## 2024-07-08 DIAGNOSIS — H401131 Primary open-angle glaucoma, bilateral, mild stage: Secondary | ICD-10-CM | POA: Diagnosis not present

## 2024-07-08 DIAGNOSIS — N3020 Other chronic cystitis without hematuria: Secondary | ICD-10-CM | POA: Diagnosis not present

## 2024-07-08 DIAGNOSIS — E782 Mixed hyperlipidemia: Secondary | ICD-10-CM | POA: Diagnosis not present

## 2024-07-08 DIAGNOSIS — N3010 Interstitial cystitis (chronic) without hematuria: Secondary | ICD-10-CM | POA: Diagnosis not present

## 2024-07-08 DIAGNOSIS — E11649 Type 2 diabetes mellitus with hypoglycemia without coma: Secondary | ICD-10-CM | POA: Diagnosis not present

## 2024-07-15 ENCOUNTER — Ambulatory Visit
Admission: RE | Admit: 2024-07-15 | Discharge: 2024-07-15 | Disposition: A | Discharge status: Home / Self Care | Source: Ambulatory Visit | Attending: Endocrinology | Admitting: Endocrinology

## 2024-07-15 ENCOUNTER — Encounter: Payer: Self-pay | Admitting: Acute Care

## 2024-07-15 DIAGNOSIS — E7800 Pure hypercholesterolemia, unspecified: Secondary | ICD-10-CM | POA: Diagnosis not present

## 2024-07-15 DIAGNOSIS — E1165 Type 2 diabetes mellitus with hyperglycemia: Secondary | ICD-10-CM | POA: Diagnosis not present

## 2024-07-15 DIAGNOSIS — R809 Proteinuria, unspecified: Secondary | ICD-10-CM | POA: Diagnosis not present

## 2024-07-15 DIAGNOSIS — E041 Nontoxic single thyroid nodule: Secondary | ICD-10-CM

## 2024-07-19 DIAGNOSIS — I2510 Atherosclerotic heart disease of native coronary artery without angina pectoris: Secondary | ICD-10-CM | POA: Diagnosis not present

## 2024-07-19 DIAGNOSIS — N1832 Chronic kidney disease, stage 3b: Secondary | ICD-10-CM | POA: Diagnosis not present

## 2024-07-29 DIAGNOSIS — M25552 Pain in left hip: Secondary | ICD-10-CM | POA: Diagnosis not present

## 2024-07-29 DIAGNOSIS — J432 Centrilobular emphysema: Secondary | ICD-10-CM | POA: Diagnosis not present

## 2024-07-29 DIAGNOSIS — E1121 Type 2 diabetes mellitus with diabetic nephropathy: Secondary | ICD-10-CM | POA: Diagnosis not present

## 2024-07-29 DIAGNOSIS — R801 Persistent proteinuria, unspecified: Secondary | ICD-10-CM | POA: Diagnosis not present

## 2024-07-29 DIAGNOSIS — E782 Mixed hyperlipidemia: Secondary | ICD-10-CM | POA: Diagnosis not present

## 2024-07-29 DIAGNOSIS — E11319 Type 2 diabetes mellitus with unspecified diabetic retinopathy without macular edema: Secondary | ICD-10-CM | POA: Diagnosis not present

## 2024-07-29 DIAGNOSIS — F17210 Nicotine dependence, cigarettes, uncomplicated: Secondary | ICD-10-CM | POA: Diagnosis not present

## 2024-07-29 DIAGNOSIS — N1832 Chronic kidney disease, stage 3b: Secondary | ICD-10-CM | POA: Diagnosis not present

## 2024-07-29 DIAGNOSIS — H401131 Primary open-angle glaucoma, bilateral, mild stage: Secondary | ICD-10-CM | POA: Diagnosis not present

## 2024-07-29 DIAGNOSIS — M8580 Other specified disorders of bone density and structure, unspecified site: Secondary | ICD-10-CM | POA: Diagnosis not present

## 2024-07-29 DIAGNOSIS — I10 Essential (primary) hypertension: Secondary | ICD-10-CM | POA: Diagnosis not present

## 2024-07-29 DIAGNOSIS — I2510 Atherosclerotic heart disease of native coronary artery without angina pectoris: Secondary | ICD-10-CM | POA: Diagnosis not present

## 2024-07-30 DIAGNOSIS — Z780 Asymptomatic menopausal state: Secondary | ICD-10-CM | POA: Diagnosis not present

## 2024-07-30 DIAGNOSIS — I10 Essential (primary) hypertension: Secondary | ICD-10-CM | POA: Diagnosis not present

## 2024-07-30 DIAGNOSIS — E1165 Type 2 diabetes mellitus with hyperglycemia: Secondary | ICD-10-CM | POA: Diagnosis not present

## 2024-07-30 DIAGNOSIS — R319 Hematuria, unspecified: Secondary | ICD-10-CM | POA: Diagnosis not present

## 2024-07-30 DIAGNOSIS — E041 Nontoxic single thyroid nodule: Secondary | ICD-10-CM | POA: Diagnosis not present

## 2024-07-30 DIAGNOSIS — M8580 Other specified disorders of bone density and structure, unspecified site: Secondary | ICD-10-CM | POA: Diagnosis not present

## 2024-07-30 DIAGNOSIS — N200 Calculus of kidney: Secondary | ICD-10-CM | POA: Diagnosis not present

## 2024-07-30 DIAGNOSIS — N189 Chronic kidney disease, unspecified: Secondary | ICD-10-CM | POA: Diagnosis not present

## 2024-07-30 DIAGNOSIS — E7800 Pure hypercholesterolemia, unspecified: Secondary | ICD-10-CM | POA: Diagnosis not present

## 2024-07-30 DIAGNOSIS — R809 Proteinuria, unspecified: Secondary | ICD-10-CM | POA: Diagnosis not present

## 2024-08-08 DIAGNOSIS — H401131 Primary open-angle glaucoma, bilateral, mild stage: Secondary | ICD-10-CM | POA: Diagnosis not present

## 2024-08-08 DIAGNOSIS — N1832 Chronic kidney disease, stage 3b: Secondary | ICD-10-CM | POA: Diagnosis not present

## 2024-08-08 DIAGNOSIS — E782 Mixed hyperlipidemia: Secondary | ICD-10-CM | POA: Diagnosis not present

## 2024-08-08 DIAGNOSIS — E11649 Type 2 diabetes mellitus with hypoglycemia without coma: Secondary | ICD-10-CM | POA: Diagnosis not present

## 2024-08-16 NOTE — Progress Notes (Unsigned)
 [facility] [address] [place] [address] Phone: [phone] Subjective:   ISusannah Gully, am serving as a scribe for Dr. Arthea Claudene.  I'm seeing this patient by the request  of:  Okey Carlin Redbird, MD  CC: Back and hip pain  YEP:Dlagzrupcz  04/13/2024 Moderate arthritis of the hip.  Continue to monitor.  Discussed if worsening pain would consider the possibility of an MRI with patient failing all other conservative therapy.  Depending on the MRI would decide if any type of surgical intervention would be necessary.  Patient will follow-up with me again in 3 months if needed.     Updated 08/17/2024 MARA FAVERO is a 73 y.o. female coming in with complaint of hip pain, known arthritis. Last 2 weeks have been the worst since we last saw her. Pain seems to come and go. Feels like it catches every once in a while.  Has been followed closely for recurrent kidney stones.     Past Medical History:  Diagnosis Date   Coronary artery calcification seen on CAT scan 02/08/2016   Diabetes mellitus without complication (HCC)    Hyperlipidemia    Hypertension    Interstitial cystitis    Kidney stones    Osteopenia    No past surgical history on file. Social History   Socioeconomic History   Marital status: Married    Spouse name: Not on file   Number of children: Not on file   Years of education: Not on file   Highest education level: Not on file  Occupational History   Not on file  Tobacco Use   Smoking status: Every Day    Current packs/day: 1.00    Average packs/day: 1 pack/day for 35.0 years (35.0 ttl pk-yrs)    Types: Cigarettes   Smokeless tobacco: Never   Tobacco comments:    Counseled to quit smoking.  Substance and Sexual Activity   Alcohol use: No    Alcohol/week: 0.0 standard drinks of alcohol   Drug use: No   Sexual activity: Not on file  Other Topics Concern   Not on file  Social History Narrative   Not on file    Social Drivers of Health   Financial Resource Strain: Not on file  Food Insecurity: Not on file  Transportation Needs: Not on file  Physical Activity: Not on file  Stress: Not on file  Social Connections: Not on file   Allergies  Allergen Reactions   Other Other (See Comments)    bladder medication unsure of name, makes her feel bad   Sulfa Antibiotics Nausea And Vomiting   Sulfacetamide Sodium Nausea And Vomiting   Nitrofurantoin Macrocrystal Nausea Only   Ramipril Other (See Comments)   Simvastatin Other (See Comments)   Family History  Problem Relation Age of Onset   Arrhythmia Mother    Colon cancer Father    Allergic rhinitis Neg Hx    Angioedema Neg Hx    Asthma Neg Hx    Eczema Neg Hx    Immunodeficiency Neg Hx    Urticaria Neg Hx     Current Outpatient Medications (Endocrine & Metabolic):    FARXIGA 10 MG TABS tablet, Take 10 mg by mouth daily.  Current Outpatient Medications (Cardiovascular):    amLODipine (NORVASC) 2.5 MG tablet, Take 2.5 mg by mouth daily.   rosuvastatin  (CRESTOR ) 40 MG tablet, TAKE 1 TABLET(40 MG) BY MOUTH DAILY   telmisartan (MICARDIS) 40 MG tablet, Take 80  mg by mouth daily.   Current Outpatient Medications (Analgesics):    aspirin EC 81 MG tablet, Take 81 mg by mouth daily.   Current Outpatient Medications (Other):    Cholecalciferol (D3-1000 PO), Take 1 tablet by mouth daily.   Continuous Glucose Sensor (DEXCOM G7 SENSOR) MISC, every 10 days   timolol (TIMOPTIC) 0.5 % ophthalmic solution, INT 1 GTT IN OU BID   Reviewed prior external information including notes and imaging from  primary care provider As well as notes that were available from care everywhere and other healthcare systems.  Past medical history, social, surgical and family history all reviewed in electronic medical record.  No pertanent information unless stated regarding to the chief complaint.   Review of Systems:  No headache, visual changes, nausea,  vomiting, diarrhea, constipation, dizziness, abdominal pain, skin rash, fevers, chills, night sweats, weight loss, swollen lymph nodes, body aches, joint swelling, chest pain, shortness of breath, mood changes. POSITIVE muscle aches  Objective  There were no vitals taken for this visit.   General: No apparent distress alert and oriented x3 mood and affect normal, dressed appropriately.  HEENT: Pupils equal, extraocular movements intact  Respiratory: Patient's speak in full sentences and does not appear short of breath  Cardiovascular: No lower extremity edema, non tender, no erythema  Back exam shows mild loss of lordosis noted. Hip exam shows left hip does have some decrease in internal and external range of motion noted today.  Significantly different than the past exam.  Only 5 degrees of internal range of motion noted.  Antalgic gait noted.     Impression and Recommendations:     The above documentation has been reviewed and is accurate and complete Shaney Deckman M Ledarius Leeson, DO

## 2024-08-17 ENCOUNTER — Ambulatory Visit: Admitting: Family Medicine

## 2024-08-17 ENCOUNTER — Encounter: Payer: Self-pay | Admitting: Family Medicine

## 2024-08-17 VITALS — BP 114/64 | HR 57 | Ht 61.0 in | Wt 130.0 lb

## 2024-08-17 DIAGNOSIS — M1612 Unilateral primary osteoarthritis, left hip: Secondary | ICD-10-CM | POA: Diagnosis not present

## 2024-08-17 NOTE — Assessment & Plan Note (Addendum)
 Discussed with patient at great length.  Discussed with patient about icing regimen and home exercises, discussed which activities to do and which ones to avoid.  Increase activity slowly.  Discussed icing regimen.  We discussed different treatment options including Tylenol supplementation as well as Cymbalta and warned of potential side effects.  Patient wants to read about this.  We also discussed the possibility of surgical intervention with patient having moderate to severe arthritis which is limiting activities of daily living.  Will have patient referred to orthopedic surgeon to discuss surgical intervention if patient would like.  Follow-up with me again in 10 to 12 weeks.  Total time with patient 32 minutes

## 2024-08-17 NOTE — Patient Instructions (Addendum)
 Good to see you. Tylenol 500 mg 3x a day Read about Cymbalta Can injection hip when you would like Referral Ortho See me again in 10 weeks

## 2024-08-30 ENCOUNTER — Ambulatory Visit
Admission: RE | Admit: 2024-08-30 | Discharge: 2024-08-30 | Disposition: A | Discharge status: Home / Self Care | Source: Ambulatory Visit

## 2024-08-30 DIAGNOSIS — Z87891 Personal history of nicotine dependence: Secondary | ICD-10-CM

## 2024-08-30 DIAGNOSIS — F17210 Nicotine dependence, cigarettes, uncomplicated: Secondary | ICD-10-CM

## 2024-08-30 DIAGNOSIS — Z122 Encounter for screening for malignant neoplasm of respiratory organs: Secondary | ICD-10-CM

## 2024-09-07 ENCOUNTER — Other Ambulatory Visit: Payer: Self-pay

## 2024-09-07 DIAGNOSIS — E782 Mixed hyperlipidemia: Secondary | ICD-10-CM | POA: Diagnosis not present

## 2024-09-07 DIAGNOSIS — N1832 Chronic kidney disease, stage 3b: Secondary | ICD-10-CM | POA: Diagnosis not present

## 2024-09-07 DIAGNOSIS — H401131 Primary open-angle glaucoma, bilateral, mild stage: Secondary | ICD-10-CM | POA: Diagnosis not present

## 2024-09-07 DIAGNOSIS — Z122 Encounter for screening for malignant neoplasm of respiratory organs: Secondary | ICD-10-CM

## 2024-09-07 DIAGNOSIS — F17210 Nicotine dependence, cigarettes, uncomplicated: Secondary | ICD-10-CM

## 2024-09-07 DIAGNOSIS — Z87891 Personal history of nicotine dependence: Secondary | ICD-10-CM

## 2024-09-07 DIAGNOSIS — E11649 Type 2 diabetes mellitus with hypoglycemia without coma: Secondary | ICD-10-CM | POA: Diagnosis not present

## 2024-09-09 ENCOUNTER — Other Ambulatory Visit (INDEPENDENT_AMBULATORY_CARE_PROVIDER_SITE_OTHER): Payer: Self-pay

## 2024-09-09 ENCOUNTER — Ambulatory Visit (INDEPENDENT_AMBULATORY_CARE_PROVIDER_SITE_OTHER): Admitting: Orthopaedic Surgery

## 2024-09-09 DIAGNOSIS — M25552 Pain in left hip: Secondary | ICD-10-CM

## 2024-09-09 DIAGNOSIS — M1612 Unilateral primary osteoarthritis, left hip: Secondary | ICD-10-CM | POA: Diagnosis not present

## 2024-09-09 NOTE — Progress Notes (Signed)
 The patient is a very pleasant 73 year old female who was sent from Dr. Arthea Sharps to evaluate and treat known arthritis of her left hip.  She says that she is thinking about hip replacement surgery but may want to consider this not until January February.  She has not had an intra-articular injection in her hip but I think that is a good idea to consider that type of injection to help her through the rest of this year.  She does walk with a slight limp.  She reports groin pain in the left hip and pain that does wake her up at night as well.  Sometimes this causes left knee pain.  She said her last hemoglobin A1c was 5.8.  I was able to review all of her past medical history and medications within epic.  On exam her right hip moves smoothly and fluidly.  The left hip has significant pain with internal and external rotation as well as stiffness with rotation.  Standing AP as well as and lateral left hip shows end-stage arthritis of the left hip with near bone-on-bone wear as well as superior lateral flattening of the femoral head and osteophytes.  The right hip joint space is well-maintained.  I did go over her imaging studies with her and talked about hip replacement surgery in detail showing her hip replacement model and gave her handout about hip replacement surgery.  We talked about what to expect from an intraoperative and postoperative standpoint.  She is interested in having Dr. Sharps see her again to place a steroid injection or ultrasound her left hip joint I think this is reasonable.  We will then see her back around December of this year to discuss hip replacement surgery again if she would like to consider this for January February of next year.

## 2024-09-13 DIAGNOSIS — Z23 Encounter for immunization: Secondary | ICD-10-CM | POA: Diagnosis not present

## 2024-09-29 DIAGNOSIS — E7800 Pure hypercholesterolemia, unspecified: Secondary | ICD-10-CM | POA: Diagnosis not present

## 2024-09-29 LAB — LIPID PANEL
Chol/HDL Ratio: 2.7 ratio (ref 0.0–4.4)
Cholesterol, Total: 153 mg/dL (ref 100–199)
HDL: 56 mg/dL (ref >39)
LDL Chol Calc (NIH): 84 mg/dL (ref 0–99)
Triglycerides: 63 mg/dL (ref 0–149)
VLDL Cholesterol Cal: 13 mg/dL (ref 5–40)

## 2024-10-04 ENCOUNTER — Ambulatory Visit: Payer: Self-pay | Admitting: Pharmacist

## 2024-10-04 ENCOUNTER — Telehealth: Payer: Self-pay | Admitting: Pharmacist

## 2024-10-04 MED ORDER — EZETIMIBE 10 MG PO TABS: 5.0000 mg | ORAL_TABLET | Freq: Every day | ORAL | 3 refills | Status: AC

## 2024-10-04 NOTE — Telephone Encounter (Signed)
 Patient called.  She previously had made an appointment to go over her lab work but called to see if we can discuss on the phone.  Advise that was perfectly fine.  We reviewed her most recent labs which have not changed much at all with lifestyle intervention.  She reports that she has quit smoking as of Oct 3.  Congratulated her on this.  She is not using any pharmacologic help nor does she want to. She previously was on ezetimibe  but it caused muscle pains.  Asking if she could try a lower dosage.  Advised that there is some data that 5 mg can be effective.  Therefore we will try ezetimibe  5 mg half a tablet daily along with her rosuvastatin .  I will reach out to her in a few months and we will set up repeat labs. Encouraged her to increase her exercise and increase fruits and vegetables in her diet.  Reports having arthritis in her hip, at some point needs a hip replacement.  Will get cortisone shots for now.  Encouraged movement.

## 2024-10-06 ENCOUNTER — Ambulatory Visit: Admitting: Pharmacist

## 2024-10-11 ENCOUNTER — Encounter: Payer: Self-pay | Admitting: Radiology

## 2024-10-18 NOTE — Telephone Encounter (Signed)
 Patient called and I returned her VM. Wants to try taking zetia  5mg  BID. She is doing well on just 5mg . Advised that is fine to try. She will call me in 2 weeks to let me know how she is doing.

## 2024-10-21 NOTE — Progress Notes (Unsigned)
 [facility] [address] [place] [address] Phone: [phone] Subjective:   Traci Berger, am serving as a scribe for Dr. Arthea Claudene.  I'm seeing this patient by the request  of:  Okey Carlin Redbird, MD  CC: Left hip pain  YEP:Dlagzrupcz  08/17/2024 Discussed with patient at great length.  Discussed with patient about icing regimen and home exercises, discussed which activities to do and which ones to avoid.  Increase activity slowly.  Discussed icing regimen.  We discussed different treatment options including Tylenol supplementation as well as Cymbalta and warned of potential side effects.  Patient wants to read about this.  We also discussed the possibility of surgical intervention with patient having moderate to severe arthritis which is limiting activities of daily living.  Will have patient referred to orthopedic surgeon to discuss surgical intervention if patient would like.  Follow-up with me again in 10 to 12 weeks.  Total time with patient 32 minutes      Update 10/22/2024 Traci Berger is a 73 y.o. female coming in with complaint of L hip pain. Saw Dr. Vernetta on 09/09/2024. Patient states that she still has pain in the hip but it is not as bad.       Past Medical History:  Diagnosis Date   Coronary artery calcification seen on CAT scan 02/08/2016   Diabetes mellitus without complication (HCC)    Hyperlipidemia    Hypertension    Interstitial cystitis    Kidney stones    Osteopenia    No past surgical history on file. Social History   Socioeconomic History   Marital status: Married    Spouse name: Not on file   Number of children: Not on file   Years of education: Not on file   Highest education level: Not on file  Occupational History   Not on file  Tobacco Use   Smoking status: Every Day    Current packs/day: 1.00    Average packs/day: 1 pack/day for 35.0 years (35.0 ttl pk-yrs)    Types: Cigarettes   Smokeless  tobacco: Never   Tobacco comments:    Counseled to quit smoking.  Substance and Sexual Activity   Alcohol use: No    Alcohol/week: 0.0 standard drinks of alcohol   Drug use: No   Sexual activity: Not on file  Other Topics Concern   Not on file  Social History Narrative   Not on file   Social Drivers of Health   Financial Resource Strain: Not on file  Food Insecurity: Not on file  Transportation Needs: Not on file  Physical Activity: Not on file  Stress: Not on file  Social Connections: Not on file   Allergies  Allergen Reactions   Other Other (See Comments)    bladder medication unsure of name, makes her feel bad   Sulfa Antibiotics Nausea And Vomiting   Sulfacetamide Sodium Nausea And Vomiting   Nitrofurantoin Macrocrystal Nausea Only   Ramipril Other (See Comments)   Simvastatin Other (See Comments)   Family History  Problem Relation Age of Onset   Arrhythmia Mother    Colon cancer Father    Allergic rhinitis Neg Hx    Angioedema Neg Hx    Asthma Neg Hx    Eczema Neg Hx    Immunodeficiency Neg Hx    Urticaria Neg Hx     Current Outpatient Medications (Endocrine & Metabolic):    FARXIGA 10 MG TABS tablet, Take 10  mg by mouth daily.  Current Outpatient Medications (Cardiovascular):    amLODipine (NORVASC) 2.5 MG tablet, Take 2.5 mg by mouth daily.   ezetimibe  (ZETIA ) 10 MG tablet, Take 0.5 tablets (5 mg total) by mouth daily.   rosuvastatin  (CRESTOR ) 40 MG tablet, TAKE 1 TABLET(40 MG) BY MOUTH DAILY   telmisartan (MICARDIS) 40 MG tablet, Take 80 mg by mouth daily.   Current Outpatient Medications (Analgesics):    aspirin EC 81 MG tablet, Take 81 mg by mouth daily.   Current Outpatient Medications (Other):    Cholecalciferol (D3-1000 PO), Take 1 tablet by mouth daily.   Continuous Glucose Sensor (DEXCOM G7 SENSOR) MISC, every 10 days   timolol (TIMOPTIC) 0.5 % ophthalmic solution, INT 1 GTT IN OU BID   Reviewed prior external information including  notes and imaging from  primary care provider As well as notes that were available from care everywhere and other healthcare systems.  Past medical history, social, surgical and family history all reviewed in electronic medical record.  No pertanent information unless stated regarding to the chief complaint.   Review of Systems:  No headache, visual changes, nausea, vomiting, diarrhea, constipation, dizziness, abdominal pain, skin rash, fevers, chills, night sweats, weight loss, swollen lymph nodes, body aches, joint swelling, chest pain, shortness of breath, mood changes. POSITIVE muscle aches  Objective  Blood pressure 124/72, pulse 64, height 5' 1 (1.549 m), weight 136 lb (61.7 kg), SpO2 99%.   General: No apparent distress alert and oriented x3 mood and affect normal, dressed appropriately.  HEENT: Pupils equal, extraocular movements intact  Respiratory: Patient's speak in full sentences and does not appear short of breath  Cardiovascular: No lower extremity edema, non tender, no erythema   Left hip exam shows patient does have some limited range of motion noted significantly with internal range of motion.  Procedure: Real-time Ultrasound Guided Injection of left intra-articular hip Device: GE Logiq Q7  Ultrasound guided injection is preferred based studies that show increased duration, increased effect, greater accuracy, decreased procedural pain, increased response rate with ultrasound guided versus blind injection.  Verbal informed consent obtained.  Time-out conducted.  Noted no overlying erythema, induration, or other signs of local infection.  Skin prepped in a sterile fashion.  Local anesthesia: Topical Ethyl chloride.  With sterile technique and under real time ultrasound guidance:  Anterior capsule visualized, needle visualized going to the head neck junction at the anterior capsule. Pictures taken. Patient did have injection of 3 cc of 0.5% Marcaine, and 1 cc of Kenalog 40  mg/dL. Completed without difficulty  Pain immediately resolved suggesting accurate placement of the medication.  Advised to call if fevers/chills, erythema, induration, drainage, or persistent bleeding.  Images permanently stored  Impression: Technically successful ultrasound guided injection.  Patient was able to ambulate significantly better with significantly less pain immediately.   Impression and Recommendations:    The above documentation has been reviewed and is accurate and complete Jamala Kohen M Kaimen Peine, DO

## 2024-10-22 ENCOUNTER — Ambulatory Visit: Admitting: Family Medicine

## 2024-10-22 ENCOUNTER — Encounter: Payer: Self-pay | Admitting: Family Medicine

## 2024-10-22 ENCOUNTER — Other Ambulatory Visit: Payer: Self-pay

## 2024-10-22 VITALS — BP 124/72 | HR 64 | Ht 61.0 in | Wt 136.0 lb

## 2024-10-22 DIAGNOSIS — M1612 Unilateral primary osteoarthritis, left hip: Secondary | ICD-10-CM

## 2024-10-22 DIAGNOSIS — M25552 Pain in left hip: Secondary | ICD-10-CM

## 2024-10-22 NOTE — Assessment & Plan Note (Signed)
 Patient given injection and tolerated the procedure well, discussed icing regimen and home exercises, discussed which activities to do and which ones to avoid.  Increase activity slowly.  Discussed icing regimen.  Patient has seen orthopedic surgery and is discussing the possibility of replacement next year.  I think that this is a good idea with the severity of it but we will see how patient responds to the injection.  Following up with orthopedics in December

## 2024-10-22 NOTE — Patient Instructions (Addendum)
Injected hip today See me again in

## 2024-11-02 ENCOUNTER — Ambulatory Visit: Admitting: Family Medicine

## 2024-11-17 ENCOUNTER — Encounter: Payer: Self-pay | Admitting: Orthopaedic Surgery

## 2024-11-17 ENCOUNTER — Ambulatory Visit: Admitting: Orthopaedic Surgery

## 2024-11-17 DIAGNOSIS — M25552 Pain in left hip: Secondary | ICD-10-CM

## 2024-11-17 DIAGNOSIS — M1612 Unilateral primary osteoarthritis, left hip: Secondary | ICD-10-CM | POA: Diagnosis not present

## 2024-11-17 NOTE — Progress Notes (Signed)
 The patient is a 73 year old female well-known to me.  She does have significant arthritis of her left hip.  She is also followed by Dr. Zachary Smith from sports medicine.  He recently placed a steroid injection in her left hip joint under ultrasound on October 19, 2024.  She says so far that injection has done great for her.  On exam she does have still stiffness with internal and external rotation of her left hip and pain in the groin but she is mobilizing well and she says she is not ready for hip replacement surgery as of yet and I agree with this as well.  We had a long thorough discussion about her hip.  She will come back and see us  in 2 months and we both agreed that was the right time in terms of seeing how she is doing with her left hip and determining whether or not another injection down the road would be worthwhile versus considering hip replacement surgery.  From a hip injection standpoint with the steroid, I usually recommend going between 5 and 6 months between steroid injections because of the detrimental effect this can have on the femoral head compared to knee injections.  She understands this as well.  Only see her in 2 months no x-rays are needed.  All question concerns were answered and addressed.  If she does have worsening problems before then she knows to let us  know.

## 2025-01-20 ENCOUNTER — Ambulatory Visit: Admitting: Orthopaedic Surgery
